# Patient Record
Sex: Female | Born: 1965
Health system: Southern US, Community
[De-identification: ages and names within clinical notes are randomized; demographics above are authoritative.]

## PROBLEM LIST (undated history)

## (undated) DIAGNOSIS — J45909 Unspecified asthma, uncomplicated: Secondary | ICD-10-CM

## (undated) HISTORY — PX: CHOLECYSTECTOMY: SHX55

## (undated) HISTORY — PX: BACK SURGERY: SHX140

## (undated) HISTORY — PX: TUBAL LIGATION: SHX77

---

## 2007-04-17 ENCOUNTER — Ambulatory Visit (HOSPITAL_COMMUNITY): Admission: RE | Admit: 2007-04-17 | Discharge: 2007-04-17 | Payer: Self-pay | Admitting: Family Medicine

## 2007-04-18 ENCOUNTER — Ambulatory Visit (HOSPITAL_COMMUNITY): Admission: RE | Admit: 2007-04-18 | Discharge: 2007-04-18 | Payer: Self-pay | Admitting: Family Medicine

## 2010-03-13 ENCOUNTER — Encounter: Payer: Self-pay | Admitting: Family Medicine

## 2010-03-13 ENCOUNTER — Encounter: Payer: Self-pay | Admitting: Neurology

## 2010-04-14 ENCOUNTER — Emergency Department (HOSPITAL_COMMUNITY): Payer: BC Managed Care – PPO

## 2010-04-14 ENCOUNTER — Emergency Department (HOSPITAL_COMMUNITY)
Admission: EM | Admit: 2010-04-14 | Discharge: 2010-04-14 | Disposition: A | Discharge status: Home / Self Care | Payer: BC Managed Care – PPO | Attending: Emergency Medicine | Admitting: Emergency Medicine

## 2010-04-14 DIAGNOSIS — R209 Unspecified disturbances of skin sensation: Secondary | ICD-10-CM | POA: Insufficient documentation

## 2010-04-14 DIAGNOSIS — R071 Chest pain on breathing: Secondary | ICD-10-CM | POA: Insufficient documentation

## 2010-04-14 DIAGNOSIS — R51 Headache: Secondary | ICD-10-CM | POA: Insufficient documentation

## 2010-04-14 LAB — CBC
HCT: 34 % — ABNORMAL LOW (ref 36.0–46.0)
Hemoglobin: 11.3 g/dL — ABNORMAL LOW (ref 12.0–15.0)
MCH: 27.2 pg (ref 26.0–34.0)
MCHC: 33.2 g/dL (ref 30.0–36.0)
MCV: 81.9 fL (ref 78.0–100.0)
RDW: 13.9 % (ref 11.5–15.5)

## 2010-04-14 LAB — BASIC METABOLIC PANEL
CO2: 26 mEq/L (ref 19–32)
Chloride: 103 mEq/L (ref 96–112)
Creatinine, Ser: 0.76 mg/dL (ref 0.4–1.2)
GFR calc Af Amer: >60 mL/min (ref >60)
Glucose, Bld: 88 mg/dL (ref 70–99)
Sodium: 138 mEq/L (ref 135–145)

## 2010-04-14 LAB — DIFFERENTIAL
Eosinophils Relative: 3 % (ref 0–5)
Lymphocytes Relative: 27 % (ref 12–46)
Monocytes Absolute: 0.4 10*3/uL (ref 0.1–1.0)
Monocytes Relative: 5 % (ref 3–12)
Neutro Abs: 6.1 10*3/uL (ref 1.7–7.7)

## 2011-05-12 ENCOUNTER — Other Ambulatory Visit: Payer: Self-pay | Admitting: Family Medicine

## 2011-05-12 ENCOUNTER — Ambulatory Visit (HOSPITAL_COMMUNITY)
Admission: RE | Admit: 2011-05-12 | Discharge: 2011-05-12 | Disposition: A | Discharge status: Home / Self Care | Payer: Self-pay | Source: Ambulatory Visit | Attending: Family Medicine | Admitting: Family Medicine

## 2011-05-12 DIAGNOSIS — M25561 Pain in right knee: Secondary | ICD-10-CM

## 2011-05-12 DIAGNOSIS — M79641 Pain in right hand: Secondary | ICD-10-CM

## 2011-05-12 DIAGNOSIS — S59909A Unspecified injury of unspecified elbow, initial encounter: Secondary | ICD-10-CM | POA: Insufficient documentation

## 2011-05-12 DIAGNOSIS — M25569 Pain in unspecified knee: Secondary | ICD-10-CM | POA: Insufficient documentation

## 2011-05-12 DIAGNOSIS — M25521 Pain in right elbow: Secondary | ICD-10-CM

## 2011-05-12 DIAGNOSIS — W19XXXA Unspecified fall, initial encounter: Secondary | ICD-10-CM | POA: Insufficient documentation

## 2011-05-12 DIAGNOSIS — M25529 Pain in unspecified elbow: Secondary | ICD-10-CM | POA: Insufficient documentation

## 2011-05-12 DIAGNOSIS — M79609 Pain in unspecified limb: Secondary | ICD-10-CM | POA: Insufficient documentation

## 2011-05-12 DIAGNOSIS — S6990XA Unspecified injury of unspecified wrist, hand and finger(s), initial encounter: Secondary | ICD-10-CM | POA: Insufficient documentation

## 2012-07-01 ENCOUNTER — Other Ambulatory Visit: Payer: Self-pay | Admitting: Nurse Practitioner

## 2012-07-01 ENCOUNTER — Other Ambulatory Visit: Payer: Self-pay | Admitting: Family Medicine

## 2012-07-01 NOTE — Telephone Encounter (Signed)
Called in refills to [facility]. Spoke with pharmacist directly.

## 2012-07-01 NOTE — Telephone Encounter (Signed)
Ok times 2 total 

## 2012-07-04 ENCOUNTER — Other Ambulatory Visit: Payer: Self-pay | Admitting: Nurse Practitioner

## 2012-09-19 ENCOUNTER — Other Ambulatory Visit: Payer: Self-pay | Admitting: Family Medicine

## 2012-11-28 ENCOUNTER — Other Ambulatory Visit: Payer: Self-pay | Admitting: Family Medicine

## 2012-11-28 ENCOUNTER — Telehealth: Payer: Self-pay | Admitting: Family Medicine

## 2012-11-28 NOTE — Telephone Encounter (Signed)
Needs office visit. Not seen in over 1 year. Transferred to front for an appt.

## 2012-11-28 NOTE — Telephone Encounter (Signed)
Patient needs refill on hydrocodone 5/325. °

## 2012-12-04 ENCOUNTER — Encounter: Payer: Self-pay | Admitting: Family Medicine

## 2012-12-04 ENCOUNTER — Encounter: Payer: Self-pay | Admitting: Nurse Practitioner

## 2012-12-04 ENCOUNTER — Ambulatory Visit (INDEPENDENT_AMBULATORY_CARE_PROVIDER_SITE_OTHER): Payer: Managed Care, Other (non HMO) | Admitting: Nurse Practitioner

## 2012-12-04 VITALS — BP 138/82 | Temp 98.9°F | Wt 248.0 lb

## 2012-12-04 DIAGNOSIS — J209 Acute bronchitis, unspecified: Secondary | ICD-10-CM

## 2012-12-04 DIAGNOSIS — J322 Chronic ethmoidal sinusitis: Secondary | ICD-10-CM

## 2012-12-04 DIAGNOSIS — R062 Wheezing: Secondary | ICD-10-CM

## 2012-12-04 DIAGNOSIS — H109 Unspecified conjunctivitis: Secondary | ICD-10-CM

## 2012-12-04 MED ORDER — AZITHROMYCIN 250 MG PO TABS: ORAL_TABLET | ORAL | Status: DC

## 2012-12-04 MED ORDER — ALBUTEROL SULFATE (2.5 MG/3ML) 0.083% IN NEBU: INHALATION_SOLUTION | RESPIRATORY_TRACT | Status: DC

## 2012-12-04 MED ORDER — ALBUTEROL SULFATE (5 MG/ML) 0.5% IN NEBU
2.5000 mg | INHALATION_SOLUTION | Freq: Once | RESPIRATORY_TRACT | Status: AC
  Administered 2012-12-04: 2.5 mg via RESPIRATORY_TRACT

## 2012-12-04 MED ORDER — PREDNISONE 20 MG PO TABS: ORAL_TABLET | ORAL | Status: DC

## 2012-12-04 NOTE — Patient Instructions (Signed)
Zaditor eye drops as directed 

## 2012-12-05 ENCOUNTER — Encounter: Payer: Self-pay | Admitting: Nurse Practitioner

## 2012-12-05 NOTE — Progress Notes (Signed)
Subjective:  Presents with complaints of slight burning in both eyes that began this morning. Minimal drainage noted. Watery eyes. No visual changes. Smokes less than one pack per day. Complaints of cough off and on for the past month. Occasionally productive. Has also had some wheezing, has not used her inhaler. Ethmoid sinus area headache over the past week. No fever. No sore throat. Bilateral ear pain. Nausea but no vomiting. No abdominal pain.  Objective:   BP 138/82  Temp(Src) 98.9 F (37.2 C) (Oral)  Wt 248 lb (112.492 kg) NAD. Alert, oriented. TMs significant clear effusion, no erythema. Conjunctiva minimally injected. No drainage or erythema. No preauricular adenopathy. Pharynx injected with PND noted. Neck supple with mild soft slightly tender anterior adenopathy. Lungs diminished breath sounds in general with faint expiratory wheeze noted particularly anterior. Shortness of breath noted with talking. Given albuterol 2.5 mg nebulizer treatment. Airflow greatly improved with expiratory wheezes noted throughout lung fields. Color normal limit. No tachypnea. Heart regular rate rhythm.  Assessment:Acute bronchitis  Wheezing - Plan: albuterol (PROVENTIL) (5 MG/ML) 0.5% nebulizer solution 2.5 mg  Ethmoid sinusitis  Conjunctivitis  Plan: Meds ordered this encounter  Medications  . albuterol (PROVENTIL) (5 MG/ML) 0.5% nebulizer solution 2.5 mg    Sig:   . albuterol (PROVENTIL) (2.5 MG/3ML) 0.083% nebulizer solution    Sig: Use via neb q 4 hrs as needed for wheezing    Dispense:  25 vial    Refill:  2    Order Specific Question:  Supervising Provider    Answer:  Merlyn Albert [[id number]]  . predniSONE (DELTASONE) 20 MG tablet    Sig: 3 po qd x 3 d then 2 po qd x 3 d then 1 po qd x 3 d    Dispense:  18 tablet    Refill:  0    Order Specific Question:  Supervising Provider    Answer:  Merlyn Albert [[id number]]  . azithromycin (ZITHROMAX Z-PAK) 250 MG tablet    Sig: Take 2 tablets (500  mg) on  Day 1,  followed by 1 tablet (250 mg) once daily on Days 2 through 5.    Dispense:  6 each    Refill:  0    Order Specific Question:  Supervising Provider    Answer:  Merlyn Albert [[id number]]   Restart albuterol via neb or inhaler every 4 hours as needed for wheezing. Discussed importance of smoking cessation. Zaditor eyedrops as directed. Warning signs reviewed. Call back in 48 hours if no improvement, sooner if worse. Also recheck if wheezing and cough persist.

## 2012-12-09 ENCOUNTER — Encounter: Payer: Self-pay | Admitting: Nurse Practitioner

## 2012-12-09 ENCOUNTER — Ambulatory Visit (INDEPENDENT_AMBULATORY_CARE_PROVIDER_SITE_OTHER): Payer: Managed Care, Other (non HMO) | Admitting: Nurse Practitioner

## 2012-12-09 VITALS — BP 138/80 | Ht 63.0 in | Wt 249.0 lb

## 2012-12-09 DIAGNOSIS — L309 Dermatitis, unspecified: Secondary | ICD-10-CM

## 2012-12-09 DIAGNOSIS — Z5189 Encounter for other specified aftercare: Secondary | ICD-10-CM

## 2012-12-09 DIAGNOSIS — M545 Low back pain, unspecified: Secondary | ICD-10-CM

## 2012-12-09 DIAGNOSIS — R52 Pain, unspecified: Secondary | ICD-10-CM

## 2012-12-09 DIAGNOSIS — L259 Unspecified contact dermatitis, unspecified cause: Secondary | ICD-10-CM

## 2012-12-09 DIAGNOSIS — G8929 Other chronic pain: Secondary | ICD-10-CM

## 2012-12-09 MED ORDER — HYDROCODONE-ACETAMINOPHEN 5-325 MG PO TABS: 1.0000 | ORAL_TABLET | Freq: Two times a day (BID) | ORAL | Status: DC | PRN

## 2012-12-09 MED ORDER — CLOBETASOL PROPIONATE 0.05 % EX OINT: TOPICAL_OINTMENT | Freq: Two times a day (BID) | CUTANEOUS | Status: DC

## 2012-12-09 NOTE — Patient Instructions (Addendum)
Cetaphil cream as directed [employer] Relief TENS unit Ice and or heat applications  [employer] TENS unit $50  Melatonin 5-10 mg at night

## 2012-12-11 ENCOUNTER — Encounter: Payer: Self-pay | Admitting: Nurse Practitioner

## 2012-12-11 DIAGNOSIS — R52 Pain, unspecified: Secondary | ICD-10-CM | POA: Insufficient documentation

## 2012-12-11 DIAGNOSIS — M545 Low back pain, unspecified: Secondary | ICD-10-CM | POA: Insufficient documentation

## 2012-12-11 DIAGNOSIS — G8929 Other chronic pain: Secondary | ICD-10-CM | POA: Insufficient documentation

## 2012-12-11 NOTE — Assessment & Plan Note (Signed)
Plan: Given 3 separate monthly prescriptions for Vicodin 5/325 one by mouth twice a day when necessary 60 per month. Switch to clobetasol 0.05% ointment twice a day to rash on feet no more than 2 weeks at a time. Recheck 3 months, call back sooner if needed. Cetaphil cream as directed [employer] Relief TENS unit Ice and or heat applications  [employer] TENS unit $50

## 2012-12-11 NOTE — Assessment & Plan Note (Signed)
Plan: Given 3 separate monthly prescriptions for Vicodin 5/325 one by mouth twice a day when necessary 60 per month. Switch to clobetasol 0.05% ointment twice a day to rash on feet no more than 2 weeks at a time. Recheck 3 months, call back sooner if needed. Cetaphil cream as directed [employer] TENS unit Ice and or heat applications  [employer] TENS unit $50 

## 2012-12-11 NOTE — Progress Notes (Signed)
Subjective:  Presents for recheck on her chronic back pain. Has had an MRI and workup through [employer] after an injury at work at [employer] last year. Patient states she has a bulging disc in the lumbar area. 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 Cannot afford to miss work for surgery. Has done physical therapy in the past with no relief. Pain in the left low back area into the left hip down the lateral leg occasionally will go all the way to the foot. Has an active job requiring walking all day. Occasional numbness and tingling in the leg. Pain is 9/10 on a pain scale first thing in the morning. A dose of hydrocodone 5 mg/APAP 325 mg brings the pain down to 6/10 which is tolerable. Patient is able to work and function. Pain is worse on days when she works, 9/10 on pain scale in the evenings, takes her second pain pill at nighttime to help her rest. Of note patient recently completed a course of prednisone for her breathing but this also cleared up the eczema on her feet. No relief with the Diprolene cream. Prednisone did not seem to help her back.  Objective:   BP 138/80  Ht 5\' 3"  (1.6 m)  Wt 249 lb (112.946 kg)  BMI 44.12 kg/m2 NAD. Alert, oriented. Lungs clear. Heart regular rate rhythm. Minimal lumbar spinal tenderness. Distinct tenderness in the left lumbar area. SLR negative on the right, positive on the left. Reflexes normal limit lower extremities. Gait normal limit. Good ROM of the back with minimal limitation. The lateral part of her feet which were scaly at one point are clear.  Assessment:Chronic low back pain  Pain management  Eczema  questionable psoriasis  Plan: Given 3 separate monthly prescriptions for Vicodin 5/325 one by mouth twice a day when necessary 60 per month. Switch to clobetasol 0.05% ointment twice a day to rash on feet no more than 2 weeks at a time. Recheck 3 months, call back sooner if needed. Cetaphil cream as directed [employer] Relief TENS unit Ice and  or heat applications  [employer] TENS unit $50

## 2013-01-10 ENCOUNTER — Other Ambulatory Visit: Payer: Self-pay | Admitting: Family Medicine

## 2013-02-20 ENCOUNTER — Other Ambulatory Visit: Payer: Self-pay | Admitting: Nurse Practitioner

## 2013-02-21 NOTE — Telephone Encounter (Signed)
Last office visit 11/2012

## 2013-03-31 ENCOUNTER — Encounter: Payer: Self-pay | Admitting: Family Medicine

## 2013-03-31 ENCOUNTER — Ambulatory Visit (INDEPENDENT_AMBULATORY_CARE_PROVIDER_SITE_OTHER): Payer: Managed Care, Other (non HMO) | Admitting: Family Medicine

## 2013-03-31 VITALS — BP 134/90 | Temp 98.7°F | Ht 63.0 in | Wt 254.0 lb

## 2013-03-31 DIAGNOSIS — R197 Diarrhea, unspecified: Secondary | ICD-10-CM

## 2013-03-31 DIAGNOSIS — M545 Low back pain, unspecified: Secondary | ICD-10-CM

## 2013-03-31 DIAGNOSIS — L309 Dermatitis, unspecified: Secondary | ICD-10-CM

## 2013-03-31 DIAGNOSIS — R52 Pain, unspecified: Secondary | ICD-10-CM

## 2013-03-31 DIAGNOSIS — G8929 Other chronic pain: Secondary | ICD-10-CM

## 2013-03-31 DIAGNOSIS — Z5189 Encounter for other specified aftercare: Secondary | ICD-10-CM

## 2013-03-31 DIAGNOSIS — L259 Unspecified contact dermatitis, unspecified cause: Secondary | ICD-10-CM

## 2013-03-31 DIAGNOSIS — J45909 Unspecified asthma, uncomplicated: Secondary | ICD-10-CM

## 2013-03-31 MED ORDER — CLOBETASOL PROPIONATE 0.05 % EX OINT: TOPICAL_OINTMENT | CUTANEOUS | Status: DC

## 2013-03-31 MED ORDER — ALBUTEROL SULFATE HFA 108 (90 BASE) MCG/ACT IN AERS: 2.0000 | INHALATION_SPRAY | Freq: Four times a day (QID) | RESPIRATORY_TRACT | Status: DC | PRN

## 2013-03-31 MED ORDER — FLUOXETINE HCL 20 MG PO CAPS: ORAL_CAPSULE | ORAL | Status: DC

## 2013-03-31 MED ORDER — HYDROCODONE-ACETAMINOPHEN 5-325 MG PO TABS: 1.0000 | ORAL_TABLET | Freq: Two times a day (BID) | ORAL | Status: DC | PRN

## 2013-03-31 MED ORDER — DIPHENOXYLATE-ATROPINE 2.5-0.025 MG PO TABS: 1.0000 | ORAL_TABLET | Freq: Four times a day (QID) | ORAL | Status: DC | PRN

## 2013-03-31 MED ORDER — BECLOMETHASONE DIPROPIONATE 80 MCG/ACT IN AERS: 2.0000 | INHALATION_SPRAY | Freq: Two times a day (BID) | RESPIRATORY_TRACT | Status: DC

## 2013-03-31 MED ORDER — AZITHROMYCIN 250 MG PO TABS: ORAL_TABLET | ORAL | Status: DC

## 2013-03-31 NOTE — Progress Notes (Signed)
   Subjective:    Patient ID: Megan Parker, female    DOB: 09/19/1965, 48 y.o.   MRN: [id number]  Diarrhea  This is a new problem. The current episode started today. The problem occurs 5 to 10 times per day. The stool consistency is described as watery. Associated symptoms include coughing. Risk factors include ill contacts. She has tried nothing for the symptoms.   Moderate cough and wheeze foir weeks Patient does relate low back pain discomfort hurts with certain movements and rotations. 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 She still smokes she knows she needs to quit She also relates head congestion drainage coughing sinus pressure. No vomiting but some diarrhea for the past few days  Review of Systems  Respiratory: Positive for cough.   Gastrointestinal: Positive for diarrhea.  nasal d/c for awhile      Objective:   Physical Exam  Nursing note and vitals reviewed. Constitutional: She appears well-developed.  HENT:  Head: Normocephalic.  Nose: Nose normal.  Mouth/Throat: Oropharynx is clear and moist. No oropharyngeal exudate.  Neck: Neck supple.  Cardiovascular: Normal rate and normal heart sounds.   No murmur heard. Pulmonary/Chest: Effort normal. No respiratory distress. She has wheezes. She has no rales.  Lymphadenopathy:    She has no cervical adenopathy.  Skin: Skin is warm and dry.          Assessment & Plan:  Diarrhea--probably viral. Lomotil as necessary. Bland diet today. Asthma-persistent nature of this. She needs a daily steroid inhaler this was sent in. Use a rescue inhaler as necessary but gradually reduce the frequency of this as steroid inhaler helps. Patient was encouraged to quit smoking for her health and reducing asthma and cancer risk Mild sinus infection antibiotics in Chronic lumbar pain refill of hydrocodone was given

## 2013-06-10 ENCOUNTER — Ambulatory Visit (INDEPENDENT_AMBULATORY_CARE_PROVIDER_SITE_OTHER): Payer: Managed Care, Other (non HMO) | Admitting: Family Medicine

## 2013-06-10 ENCOUNTER — Encounter: Payer: Self-pay | Admitting: Family Medicine

## 2013-06-10 VITALS — BP 122/82 | Ht 63.0 in | Wt 249.0 lb

## 2013-06-10 DIAGNOSIS — J45909 Unspecified asthma, uncomplicated: Secondary | ICD-10-CM

## 2013-06-10 DIAGNOSIS — T148 Other injury of unspecified body region: Secondary | ICD-10-CM

## 2013-06-10 DIAGNOSIS — W57XXXA Bitten or stung by nonvenomous insect and other nonvenomous arthropods, initial encounter: Secondary | ICD-10-CM

## 2013-06-10 MED ORDER — NAPROXEN SODIUM 550 MG PO TABS: ORAL_TABLET | ORAL | Status: DC

## 2013-06-10 MED ORDER — HYDROCODONE-ACETAMINOPHEN 5-325 MG PO TABS: 1.0000 | ORAL_TABLET | Freq: Two times a day (BID) | ORAL | Status: DC | PRN

## 2013-06-10 MED ORDER — DOXYCYCLINE HYCLATE 100 MG PO CAPS: 100.0000 mg | ORAL_CAPSULE | Freq: Two times a day (BID) | ORAL | Status: DC

## 2013-06-10 MED ORDER — FLUOXETINE HCL 20 MG PO CAPS: ORAL_CAPSULE | ORAL | Status: DC

## 2013-06-10 NOTE — Progress Notes (Signed)
   Subjective:    Patient ID: Megan Parker, female    DOB: 04/03/1965, 48 y.o.   MRN: [id number]  HPI Patient arrives with tick bite-removed tick late Friday pm and has been tired since. Paced H6 she pulled a tick off on Friday wasn't sure how long he had been on denies any previous trouble with it. She also has history of asthma cannot afford asthma inhaler she is just using albuterol she understands the importance of quit smoking  Review of Systems Wheezing, cough, tick bite with tenderness, fatigue, no fever no vomiting    Objective:   Physical Exam Has to tear edematous areas on her sideward tic better both are tender red one is swollen red tender no abscess approximately inch in diameter the other three fourths of an inch and not check. Blood pressure good lungs reveal bilateral expiratory wheezes extremities no edema       Assessment & Plan:  Patient counseled to quit smoking Asthma she will check in with her insurance company to see if there is a low-cost preventative inhaler she could use if so she will let us know we will call again Doxycycline twice a day 10 days for tick related rash/bite  Warnings were discussed regarding high fevers or other problems followup immediately

## 2013-09-10 ENCOUNTER — Ambulatory Visit (INDEPENDENT_AMBULATORY_CARE_PROVIDER_SITE_OTHER): Payer: Managed Care, Other (non HMO) | Admitting: Nurse Practitioner

## 2013-09-10 ENCOUNTER — Ambulatory Visit (HOSPITAL_COMMUNITY)
Admission: RE | Admit: 2013-09-10 | Discharge: 2013-09-10 | Disposition: A | Discharge status: Home / Self Care | Payer: Managed Care, Other (non HMO) | Source: Ambulatory Visit | Attending: Nurse Practitioner | Admitting: Nurse Practitioner

## 2013-09-10 ENCOUNTER — Encounter: Payer: Self-pay | Admitting: Family Medicine

## 2013-09-10 ENCOUNTER — Encounter: Payer: Self-pay | Admitting: Nurse Practitioner

## 2013-09-10 VITALS — BP 134/82 | Resp 18 | Ht 63.0 in | Wt 248.0 lb

## 2013-09-10 DIAGNOSIS — J4540 Moderate persistent asthma, uncomplicated: Secondary | ICD-10-CM

## 2013-09-10 DIAGNOSIS — R0609 Other forms of dyspnea: Secondary | ICD-10-CM

## 2013-09-10 DIAGNOSIS — Z13220 Encounter for screening for lipoid disorders: Secondary | ICD-10-CM

## 2013-09-10 DIAGNOSIS — R5383 Other fatigue: Principal | ICD-10-CM

## 2013-09-10 DIAGNOSIS — J45909 Unspecified asthma, uncomplicated: Secondary | ICD-10-CM

## 2013-09-10 DIAGNOSIS — R062 Wheezing: Secondary | ICD-10-CM | POA: Insufficient documentation

## 2013-09-10 DIAGNOSIS — N912 Amenorrhea, unspecified: Secondary | ICD-10-CM

## 2013-09-10 DIAGNOSIS — R0600 Dyspnea, unspecified: Secondary | ICD-10-CM

## 2013-09-10 DIAGNOSIS — K219 Gastro-esophageal reflux disease without esophagitis: Secondary | ICD-10-CM

## 2013-09-10 DIAGNOSIS — R0602 Shortness of breath: Secondary | ICD-10-CM

## 2013-09-10 DIAGNOSIS — R0989 Other specified symptoms and signs involving the circulatory and respiratory systems: Secondary | ICD-10-CM

## 2013-09-10 DIAGNOSIS — R5381 Other malaise: Secondary | ICD-10-CM

## 2013-09-10 MED ORDER — PREDNISONE 20 MG PO TABS: ORAL_TABLET | ORAL | Status: DC

## 2013-09-10 MED ORDER — ALBUTEROL SULFATE (2.5 MG/3ML) 0.083% IN NEBU
2.5000 mg | INHALATION_SOLUTION | Freq: Once | RESPIRATORY_TRACT | Status: AC
Start: 2013-09-10 — End: 2013-09-10
  Administered 2013-09-10: 2.5 mg via RESPIRATORY_TRACT

## 2013-09-10 NOTE — Progress Notes (Signed)
Subjective:  Presents for c/o fatigue that began for past several months. Was treated for asthma in April with continued symptoms. Slight SOB and wheeze all the time even with rest. Minimal relief with albuterol inhaler. Did not get Qvar inhaler due to cost. No chest pain/ischemic type pain. No fever. Some coughing. Mild reflux mainly when she drinks water about 3/7 days of the week. Weight gain. Sleepy and tired all the time; denies symptoms of sleep apnea but not sure if family hears any snoring. Has not had intercourse in months; husband is unable. Last menses about 2 months ago. Slight swelling in lower legs at times. No orthopnea.   Objective:   BP 134/82  Resp 18  Ht 5\' 3"  (1.6 m)  Wt 248 lb (112.492 kg)  BMI 43.94 kg/m2 NAD. Alert, oriented. TMs mild clear effusion, no erythema. Pharynx mildly injected. Neck supple with mild soft anterior adenopathy. Thryoid; nontender; no masses or goiter.Lungs: initially decreased airflow with one posterior wheeze. No tachypnea but mild SOB with occas wheezing cough noted with talking. Given albuterol 2.5 mg neb treatment. Airflow at least 50 % improved with scattered exp wheezes. Heart RRR. abd soft, nondistended with very mild epigastric area tenderness. Lower extremities: trace pitting edema.  Assessment: Other malaise and fatigue - Plan: CBC with Differential, Hepatic function panel, Basic metabolic panel, TSH  DOE (dyspnea on exertion) - Plan: Brain natriuretic peptide  Amenorrhea - Plan: FSH, LH  SOB (shortness of breath) - Plan: DG Chest 2 View  Asthma, moderate persistent, uncomplicated - Plan: DG Chest 2 View, albuterol (PROVENTIL) (2.5 MG/3ML) 0.083% nebulizer solution 2.5 mg  Screening, lipid - Plan: Lipid panel  Gastroesophageal reflux disease without esophagitis    Plan:  Meds ordered this encounter  Medications  . albuterol (PROVENTIL) (2.5 MG/3ML) 0.083% nebulizer solution 2.5 mg    Sig:   . predniSONE (DELTASONE) 20 MG tablet     Sig: 2 po qd x 5 d    Dispense:  10 tablet    Refill:  0    Order Specific Question:  Supervising Provider    Answer:  Merlyn AlbertLUKING, WILLIAM S [[id number]]   Zantac OTC as directed. Patient to check to see which steroid inhaler is on her preferred list and call back to office.  Warning signs reviewed. Obtain labs and xray after Prednisone is complete. Office visit first week of August, sooner if worse.

## 2013-09-10 NOTE — Patient Instructions (Signed)
Preferred inhaled steroid

## 2013-09-11 ENCOUNTER — Telehealth: Payer: Self-pay | Admitting: Family Medicine

## 2013-09-11 ENCOUNTER — Encounter: Payer: Self-pay | Admitting: Family Medicine

## 2013-09-11 MED ORDER — ALBUTEROL SULFATE HFA 108 (90 BASE) MCG/ACT IN AERS: 2.0000 | INHALATION_SPRAY | Freq: Four times a day (QID) | RESPIRATORY_TRACT | Status: DC | PRN

## 2013-09-11 MED ORDER — HYDROCODONE-ACETAMINOPHEN 5-325 MG PO TABS: 1.0000 | ORAL_TABLET | Freq: Two times a day (BID) | ORAL | Status: DC | PRN

## 2013-09-11 NOTE — Telephone Encounter (Signed)
Patient needs work note for 7/22-7/24 returning 7/24 to work.refill on inhaler and hydrocodone5/325

## 2013-09-11 NOTE — Addendum Note (Signed)
Addended byOneal Deputy: Oluwadamilola Rosamond D on: 09/11/2013 11:40 AM   Modules accepted: Orders

## 2013-09-11 NOTE — Progress Notes (Signed)
Patient notified and verbalized understanding of the test results. No further questions. 

## 2013-09-11 NOTE — Telephone Encounter (Signed)
Work excuse done

## 2013-09-11 NOTE — Telephone Encounter (Signed)
Patient notified and verbalized understanding. 

## 2013-09-11 NOTE — Telephone Encounter (Signed)
Please write WE for patient. Thanks!

## 2013-09-11 NOTE — Telephone Encounter (Signed)
May give 6 refills on inhaler. One refill on pain medicine. If needing ongoing pain medicine this requires every 3 month visit for chronic pain management.

## 2013-09-12 ENCOUNTER — Telehealth: Payer: Self-pay | Admitting: Family Medicine

## 2013-09-12 LAB — CBC WITH DIFFERENTIAL/PLATELET
BASOS ABS: 0 10*3/uL (ref 0.0–0.1)
Basophils Relative: 0 % (ref 0–1)
EOS PCT: 0 % (ref 0–5)
Eosinophils Absolute: 0 10*3/uL (ref 0.0–0.7)
HCT: 38.5 % (ref 36.0–46.0)
Hemoglobin: 13.2 g/dL (ref 12.0–15.0)
LYMPHS ABS: 2.6 10*3/uL (ref 0.7–4.0)
LYMPHS PCT: 20 % (ref 12–46)
MCH: 27 pg (ref 26.0–34.0)
MCHC: 34.3 g/dL (ref 30.0–36.0)
MCV: 78.9 fL (ref 78.0–100.0)
Monocytes Absolute: 0.6 10*3/uL (ref 0.1–1.0)
Monocytes Relative: 5 % (ref 3–12)
NEUTROS PCT: 75 % (ref 43–77)
Neutro Abs: 9.6 10*3/uL — ABNORMAL HIGH (ref 1.7–7.7)
PLATELETS: 266 10*3/uL (ref 150–400)
RBC: 4.88 MIL/uL (ref 3.87–5.11)
RDW: 14.2 % (ref 11.5–15.5)
WBC: 12.8 10*3/uL — ABNORMAL HIGH (ref 4.0–10.5)

## 2013-09-12 LAB — TSH: TSH: 0.854 u[IU]/mL (ref 0.350–4.500)

## 2013-09-12 LAB — HEPATIC FUNCTION PANEL
ALBUMIN: 4.4 g/dL (ref 3.5–5.2)
ALK PHOS: 77 U/L (ref 39–117)
ALT: 24 U/L (ref 0–35)
AST: 14 U/L (ref 0–37)
BILIRUBIN TOTAL: 0.4 mg/dL (ref 0.2–1.2)
Bilirubin, Direct: <0.1 mg/dL (ref 0.0–0.3)
Total Protein: 6.8 g/dL (ref 6.0–8.3)

## 2013-09-12 LAB — BASIC METABOLIC PANEL
BUN: 16 mg/dL (ref 6–23)
CALCIUM: 9.7 mg/dL (ref 8.4–10.5)
CO2: 24 meq/L (ref 19–32)
CREATININE: 0.72 mg/dL (ref 0.50–1.10)
Chloride: 104 mEq/L (ref 96–112)
Glucose, Bld: 87 mg/dL (ref 70–99)
Potassium: 4.7 mEq/L (ref 3.5–5.3)
SODIUM: 139 meq/L (ref 135–145)

## 2013-09-12 LAB — FOLLICLE STIMULATING HORMONE: FSH: 15.7 m[IU]/mL

## 2013-09-12 LAB — LIPID PANEL
CHOL/HDL RATIO: 2 ratio
CHOLESTEROL: 168 mg/dL (ref 0–200)
HDL: 84 mg/dL (ref >39)
LDL Cholesterol: 65 mg/dL (ref 0–99)
Triglycerides: 96 mg/dL (ref <150)
VLDL: 19 mg/dL (ref 0–40)

## 2013-09-12 LAB — LUTEINIZING HORMONE: LH: 12.7 m[IU]/mL

## 2013-09-12 LAB — BRAIN NATRIURETIC PEPTIDE: Brain Natriuretic Peptide: 31.2 pg/mL (ref 0.0–100.0)

## 2013-09-12 NOTE — Telephone Encounter (Signed)
Patient said that she was instructed to call us and tell us what Steroid inhaler is covered by her insurance. They cover Proventil and Ventolin.  [facility]

## 2013-09-14 NOTE — Telephone Encounter (Signed)
Nurses: please advise patient that those are still albuterol inhalers NOT steroid. Please call pharmacy and see if we can find out which one is on preferred list. Thanks!

## 2013-09-15 NOTE — Telephone Encounter (Signed)
Pt said they will cover Flovent and Serevent

## 2013-09-18 ENCOUNTER — Other Ambulatory Visit: Payer: Self-pay | Admitting: Nurse Practitioner

## 2013-09-18 MED ORDER — FLUTICASONE PROPIONATE HFA 110 MCG/ACT IN AERO: 2.0000 | INHALATION_SPRAY | Freq: Two times a day (BID) | RESPIRATORY_TRACT | Status: DC

## 2013-09-18 NOTE — Telephone Encounter (Signed)
Patient notified

## 2013-09-18 NOTE — Telephone Encounter (Signed)
Sent in Rx for flovent.

## 2013-09-26 ENCOUNTER — Ambulatory Visit (INDEPENDENT_AMBULATORY_CARE_PROVIDER_SITE_OTHER): Payer: Managed Care, Other (non HMO) | Admitting: Nurse Practitioner

## 2013-09-26 ENCOUNTER — Encounter: Payer: Self-pay | Admitting: Nurse Practitioner

## 2013-09-26 VITALS — BP 140/92 | Ht 63.0 in | Wt 254.0 lb

## 2013-09-26 DIAGNOSIS — R5383 Other fatigue: Secondary | ICD-10-CM

## 2013-09-26 DIAGNOSIS — R5381 Other malaise: Secondary | ICD-10-CM

## 2013-09-26 DIAGNOSIS — R52 Pain, unspecified: Secondary | ICD-10-CM

## 2013-09-26 DIAGNOSIS — M545 Low back pain, unspecified: Secondary | ICD-10-CM

## 2013-09-26 DIAGNOSIS — R0609 Other forms of dyspnea: Secondary | ICD-10-CM

## 2013-09-26 DIAGNOSIS — Z5189 Encounter for other specified aftercare: Secondary | ICD-10-CM

## 2013-09-26 DIAGNOSIS — R0989 Other specified symptoms and signs involving the circulatory and respiratory systems: Secondary | ICD-10-CM

## 2013-09-26 DIAGNOSIS — N951 Menopausal and female climacteric states: Secondary | ICD-10-CM

## 2013-09-26 DIAGNOSIS — N912 Amenorrhea, unspecified: Secondary | ICD-10-CM

## 2013-09-26 DIAGNOSIS — R0683 Snoring: Secondary | ICD-10-CM

## 2013-09-26 DIAGNOSIS — G8929 Other chronic pain: Secondary | ICD-10-CM

## 2013-09-26 MED ORDER — HYDROCODONE-ACETAMINOPHEN 5-325 MG PO TABS
1.0000 | ORAL_TABLET | Freq: Two times a day (BID) | ORAL | Status: DC | PRN
Start: 2013-09-26 — End: 2013-12-31

## 2013-09-26 MED ORDER — MEDROXYPROGESTERONE ACETATE 10 MG PO TABS: 10.0000 mg | ORAL_TABLET | Freq: Every day | ORAL | Status: DC

## 2013-09-26 MED ORDER — HYDROCODONE-ACETAMINOPHEN 5-325 MG PO TABS: 1.0000 | ORAL_TABLET | Freq: Two times a day (BID) | ORAL | Status: DC | PRN

## 2013-09-30 ENCOUNTER — Encounter: Payer: Self-pay | Admitting: Nurse Practitioner

## 2013-09-30 DIAGNOSIS — N951 Menopausal and female climacteric states: Secondary | ICD-10-CM | POA: Insufficient documentation

## 2013-09-30 NOTE — Progress Notes (Signed)
Subjective:  Presents to review her recent labs. Has not had a menses in 2 months. No pelvic pain. Married, same partner, not sexually active at this time. Also takes hydrocodone no more than twice a day for chronic back pain. Worse some days than others. Stays tired all the time. Has been told she snores. Compulsive about cleaning and organizing her house. Everything has to be it's place. Emotional lability. Denies suicidal or homicidal thoughts or ideation.   Objective:   BP 140/92  Ht 5\' 3"  (1.6 m)  Wt 254 lb (115.214 kg)  BMI 45.01 kg/m2 NAD. Alert, oriented. Lungs clear. Heart RRR. FSH and LH on 7/24 indicate patient has NOT completed menopause. Reviewed labs with patient.   Assessment:  Problem List Items Addressed This Visit     Other   Chronic low back pain   Relevant Medications      HYDROcodone-acetaminophen (NORCO/VICODIN) 5-325 MG per tablet   Pain management    Other Visit Diagnoses   Amenorrhea    -  Primary    Other malaise and fatigue        Snoring          Plan:  Meds ordered this encounter  Medications  . DISCONTD: HYDROcodone-acetaminophen (NORCO/VICODIN) 5-325 MG per tablet    Sig: Take 1 tablet by mouth 2 (two) times daily as needed.    Dispense:  60 tablet    Refill:  0    May fill 30 days from 09/11/13    Order Specific Question:  Supervising Provider    Answer:  Merlyn AlbertLUKING, WILLIAM S [[id number]]  . HYDROcodone-acetaminophen (NORCO/VICODIN) 5-325 MG per tablet    Sig: Take 1 tablet by mouth 2 (two) times daily as needed.    Dispense:  60 tablet    Refill:  0    May fill 60 days from 09/11/13    Order Specific Question:  Supervising Provider    Answer:  Merlyn AlbertLUKING, WILLIAM S [[id number]]  . medroxyPROGESTERone (PROVERA) 10 MG tablet    Sig: Take 1 tablet (10 mg total) by mouth daily. X 10 d at least once every 3 months    Dispense:  10 tablet    Refill:  3    Order Specific Question:  Supervising Provider    Answer:  Merlyn AlbertLUKING, WILLIAM S [[id number]]   Recommend that patient  have a cycle at least every 3 months. Call if no withdrawal bleeding with Provera. Given [place] of [place]; to drop off or mail to us once she gets her husband's input. Will explore issues around OCD symptoms at next visit.  Return in about 3 months (around 12/27/2013).

## 2013-10-23 ENCOUNTER — Encounter: Payer: Self-pay | Admitting: Family Medicine

## 2013-10-23 ENCOUNTER — Ambulatory Visit (INDEPENDENT_AMBULATORY_CARE_PROVIDER_SITE_OTHER): Payer: Managed Care, Other (non HMO) | Admitting: Family Medicine

## 2013-10-23 VITALS — BP 126/86 | Temp 98.1°F | Ht 63.0 in | Wt 253.4 lb

## 2013-10-23 DIAGNOSIS — J45901 Unspecified asthma with (acute) exacerbation: Secondary | ICD-10-CM

## 2013-10-23 DIAGNOSIS — J4521 Mild intermittent asthma with (acute) exacerbation: Secondary | ICD-10-CM

## 2013-10-23 MED ORDER — PREDNISONE 20 MG PO TABS: ORAL_TABLET | ORAL | Status: DC

## 2013-10-23 MED ORDER — HYDROCODONE-HOMATROPINE 5-1.5 MG/5ML PO SYRP
5.0000 mL | ORAL_SOLUTION | Freq: Every evening | ORAL | Status: DC | PRN
Start: 2013-10-23 — End: 2013-12-31

## 2013-10-23 MED ORDER — LEVOFLOXACIN 500 MG PO TABS: 500.0000 mg | ORAL_TABLET | Freq: Every day | ORAL | Status: DC

## 2013-10-23 NOTE — Progress Notes (Signed)
   Subjective:    Patient ID: Megan Parker, female    DOB: June 16, 1965, 48 y.o.   MRN: [id number]  URI  This is a new problem. The current episode started 1 to 4 weeks ago. The problem has been unchanged. There has been no fever. Associated symptoms include congestion, coughing, ear pain and a sore throat. Associated symptoms comments: chills. She has tried decongestant for the symptoms. The treatment provided no relief.   Patient states that she has no other concerns at this time.   Patient does smoke unfortunately.  Underlying wheezing has worsened considerably. Review of Systems  HENT: Positive for congestion, ear pain and sore throat.   Respiratory: Positive for cough.        Objective:   Physical Exam Alert moderate malaise. Noticeable wheezing with cough. No tachypnea HET moderate his congestion lungs bilateral wheezes heart regular in rhythm.       Assessment & Plan:  Impression rhinosinusitis/bronchitis with exacerbation of asthma plan encouraged to stop smoking. Prednisone taper. Antibiotics prescribed. WSL

## 2013-10-24 ENCOUNTER — Encounter: Payer: Self-pay | Admitting: Family Medicine

## 2013-11-05 ENCOUNTER — Telehealth: Payer: Self-pay | Admitting: Family Medicine

## 2013-11-05 ENCOUNTER — Other Ambulatory Visit: Payer: Self-pay | Admitting: Family Medicine

## 2013-11-05 MED ORDER — MOMETASONE FUROATE 220 MCG/INH IN AEPB: 2.0000 | INHALATION_SPRAY | Freq: Every day | RESPIRATORY_TRACT | Status: DC

## 2013-11-05 NOTE — Telephone Encounter (Signed)
LMOM explaining to pt new med sent in and why

## 2013-11-05 NOTE — Telephone Encounter (Signed)
asmanex was sent and please notify patient

## 2013-11-05 NOTE — Telephone Encounter (Signed)
Pt's [employer] coverage DENIED pt's FLOVENT HFA.  Patient needs documented failure on Qvar (which pt has tried & failed) and Asmanex (no documentation of trying this one) Please advise [facility]

## 2013-12-31 ENCOUNTER — Ambulatory Visit (INDEPENDENT_AMBULATORY_CARE_PROVIDER_SITE_OTHER): Payer: Managed Care, Other (non HMO) | Admitting: Nurse Practitioner

## 2013-12-31 ENCOUNTER — Encounter: Payer: Self-pay | Admitting: Nurse Practitioner

## 2013-12-31 VITALS — BP 122/74 | Ht 63.0 in | Wt 254.0 lb

## 2013-12-31 DIAGNOSIS — M545 Low back pain: Secondary | ICD-10-CM

## 2013-12-31 DIAGNOSIS — G8929 Other chronic pain: Secondary | ICD-10-CM

## 2013-12-31 DIAGNOSIS — R52 Pain, unspecified: Secondary | ICD-10-CM

## 2013-12-31 DIAGNOSIS — N951 Menopausal and female climacteric states: Secondary | ICD-10-CM

## 2013-12-31 DIAGNOSIS — Z5189 Encounter for other specified aftercare: Secondary | ICD-10-CM

## 2013-12-31 DIAGNOSIS — L301 Dyshidrosis [pompholyx]: Secondary | ICD-10-CM

## 2013-12-31 DIAGNOSIS — J4520 Mild intermittent asthma, uncomplicated: Secondary | ICD-10-CM

## 2013-12-31 MED ORDER — HYDROCODONE-ACETAMINOPHEN 5-325 MG PO TABS: 1.0000 | ORAL_TABLET | Freq: Two times a day (BID) | ORAL | Status: DC | PRN

## 2013-12-31 MED ORDER — MEDROXYPROGESTERONE ACETATE 10 MG PO TABS: 10.0000 mg | ORAL_TABLET | Freq: Every day | ORAL | Status: DC

## 2013-12-31 MED ORDER — KETOCONAZOLE 2 % EX CREA: 1.0000 "application " | TOPICAL_CREAM | Freq: Two times a day (BID) | CUTANEOUS | Status: DC

## 2013-12-31 NOTE — Patient Instructions (Signed)
Vertical sleeve gastrectomy 

## 2014-01-02 ENCOUNTER — Encounter: Payer: Self-pay | Admitting: Nurse Practitioner

## 2014-01-02 NOTE — Progress Notes (Signed)
Subjective:  Presents for routine follow up. Finally had a normal cycle in September over a month after finishing Provera. Has some confusion about how and when to take med. Continues to have wheezing. Insurance will only cover Asmanex for preventive. Has cats in her home which contributes to her symptoms. Also smokes 1/2 ppd. Uses albuterol 2-3 x per week. Limited exercise. Some edema lower legs; has varicose veins. Continues to have eczema on both feet; comes and goes. Very pruritic. Has been applying voltaren gel which seems to help dryness.  Objective:   BP 122/74 mmHg  Ht 5\' 3"  (1.6 m)  Wt 254 lb (115.214 kg)  BMI 45.01 kg/m2  LMP 10/31/2013 NAD. Alert, oriented. Lungs clear. Heart regular rate rhythm. Superficial varicosities noted lower extremities. Minimal edema. Confluent pink shiny rash noted over the lateral part of both feet with scaling along the edges.  Assessment:  Problem List Items Addressed This Visit      Respiratory   Asthma, chronic     Other   Chronic low back pain - Primary   Relevant Medications      HYDROcodone-acetaminophen (NORCO/VICODIN) 5-325 MG per tablet   Pain management   Perimenopause   Morbid obesity    Other Visit Diagnoses    Dyshidrotic eczema          Meds ordered this encounter  Medications  . diclofenac sodium (VOLTAREN) 1 % GEL    Sig: Apply topically 2 (two) times daily.  . medroxyPROGESTERone (PROVERA) 10 MG tablet    Sig: Take 1 tablet (10 mg total) by mouth daily. X 10 d at least once every 3 months    Dispense:  10 tablet    Refill:  3    Order Specific Question:  Supervising Provider    Answer:  Merlyn AlbertLUKING, WILLIAM S [[id number]]  . ketoconazole (NIZORAL) 2 % cream    Sig: Apply 1 application topically 2 (two) times daily. X 2 weeks    Dispense:  60 g    Refill:  0    Order Specific Question:  Supervising Provider    Answer:  Merlyn AlbertLUKING, WILLIAM S [[id number]]  . DISCONTD: HYDROcodone-acetaminophen (NORCO/VICODIN) 5-325 MG per tablet    Sig:  Take 1 tablet by mouth 2 (two) times daily as needed.    Dispense:  60 tablet    Refill:  0    May fill 60 days from 12/31/13    Order Specific Question:  Supervising Provider    Answer:  Merlyn AlbertLUKING, WILLIAM S [[id number]]  . DISCONTD: HYDROcodone-acetaminophen (NORCO/VICODIN) 5-325 MG per tablet    Sig: Take 1 tablet by mouth 2 (two) times daily as needed.    Dispense:  60 tablet    Refill:  0    May fill 30 days from 12/31/13    Order Specific Question:  Supervising Provider    Answer:  Merlyn AlbertLUKING, WILLIAM S [[id number]]  . HYDROcodone-acetaminophen (NORCO/VICODIN) 5-325 MG per tablet    Sig: Take 1 tablet by mouth 2 (two) times daily as needed.    Dispense:  60 tablet    Refill:  0    Order Specific Question:  Supervising Provider    Answer:  Merlyn AlbertLUKING, WILLIAM S [[id number]]   Reviewed options regarding progesterone therapy. Patient to take Provera at a minimum once every 3 months. Call if no withdrawal bleeding. Restart steroid cream. Stop using Voltaren cream on feet. Had ketoconazole as directed. Has the appearance of dyshidrotic eczema with a possible secondary fungal infection. Possible psoriasis. Patient  to call back in 2-3 weeks if rash is not significantly improved, recommend dermatology referral at that time. Return in about 3 months (around 04/02/2014).

## 2014-02-02 ENCOUNTER — Other Ambulatory Visit: Payer: Self-pay | Admitting: Family Medicine

## 2014-03-02 ENCOUNTER — Ambulatory Visit (INDEPENDENT_AMBULATORY_CARE_PROVIDER_SITE_OTHER): Payer: Managed Care, Other (non HMO) | Admitting: Nurse Practitioner

## 2014-03-02 ENCOUNTER — Encounter: Payer: Self-pay | Admitting: Nurse Practitioner

## 2014-03-02 VITALS — BP 134/88 | Ht 63.0 in | Wt 250.4 lb

## 2014-03-02 DIAGNOSIS — J4520 Mild intermittent asthma, uncomplicated: Secondary | ICD-10-CM

## 2014-03-02 DIAGNOSIS — N951 Menopausal and female climacteric states: Secondary | ICD-10-CM

## 2014-03-02 DIAGNOSIS — R52 Pain, unspecified: Secondary | ICD-10-CM

## 2014-03-02 DIAGNOSIS — M545 Low back pain, unspecified: Secondary | ICD-10-CM

## 2014-03-02 DIAGNOSIS — G8929 Other chronic pain: Secondary | ICD-10-CM

## 2014-03-02 DIAGNOSIS — Z5189 Encounter for other specified aftercare: Secondary | ICD-10-CM

## 2014-03-02 MED ORDER — NAPROXEN SODIUM 550 MG PO TABS: ORAL_TABLET | ORAL | Status: DC

## 2014-03-02 MED ORDER — HYDROCODONE-ACETAMINOPHEN 5-325 MG PO TABS: 1.0000 | ORAL_TABLET | Freq: Two times a day (BID) | ORAL | Status: DC | PRN

## 2014-03-04 ENCOUNTER — Encounter: Payer: Self-pay | Admitting: Nurse Practitioner

## 2014-03-04 NOTE — Progress Notes (Signed)
Subjective:  Presents for routine follow up. Did not have to take Provera. Having regular menses now. Asthma fairly stable on Asmanex (this is the only med her insurance will cover at reasonable cost). Rare use of Albuterol. Taking her pain med twice a day which keeps her pain under reasonable control so she can work and perform ADL's. Has cut back on smoking.   Objective:   BP 134/88 mmHg  Ht 5\' 3"  (1.6 m)  Wt 250 lb 6 oz (113.569 kg)  BMI 44.36 kg/m2 NAD. Alert, oriented. Lungs: BS mildly diminished with occasional faint expiratory wheeze. Heart RRR. Lower extremities no edema.  Assessment:  Problem List Items Addressed This Visit      Respiratory   Asthma, chronic     Other   Chronic low back pain - Primary   Relevant Medications   naproxen sodium (ANAPROX) tablet   HYDROcodone-acetaminophen (NORCO/VICODIN) 5-325 MG per tablet   Pain management   Perimenopause     Plan:  Meds ordered this encounter  Medications  . naproxen sodium (ANAPROX) 550 MG tablet    Sig: take 1 tablet by mouth twice a day with food    Dispense:  60 tablet    Refill:  2    Order Specific Question:  Supervising Provider    Answer:  Merlyn AlbertLUKING, WILLIAM S [[id number]]  . DISCONTD: HYDROcodone-acetaminophen (NORCO/VICODIN) 5-325 MG per tablet    Sig: Take 1 tablet by mouth 2 (two) times daily as needed.    Dispense:  60 tablet    Refill:  0    Order Specific Question:  Supervising Provider    Answer:  Merlyn AlbertLUKING, WILLIAM S [[id number]]  . DISCONTD: HYDROcodone-acetaminophen (NORCO/VICODIN) 5-325 MG per tablet    Sig: Take 1 tablet by mouth 2 (two) times daily as needed.    Dispense:  60 tablet    Refill:  0    May fill 30 days from 03/02/14    Order Specific Question:  Supervising Provider    Answer:  Merlyn AlbertLUKING, WILLIAM S [[id number]]  . HYDROcodone-acetaminophen (NORCO/VICODIN) 5-325 MG per tablet    Sig: Take 1 tablet by mouth 2 (two) times daily as needed.    Dispense:  60 tablet    Refill:  0    May fill 60 days from  03/02/14    Order Specific Question:  Supervising Provider    Answer:  Merlyn AlbertLUKING, WILLIAM S [[id number]]   Discussed importance of smoking cessation. Recommend physical this year.  Return in about 3 months (around 06/01/2014) for med recheck.

## 2014-06-01 ENCOUNTER — Encounter: Payer: Self-pay | Admitting: Nurse Practitioner

## 2014-06-01 ENCOUNTER — Ambulatory Visit (INDEPENDENT_AMBULATORY_CARE_PROVIDER_SITE_OTHER): Payer: Managed Care, Other (non HMO) | Admitting: Nurse Practitioner

## 2014-06-01 VITALS — BP 132/88 | Ht 63.0 in | Wt 254.0 lb

## 2014-06-01 DIAGNOSIS — L301 Dyshidrosis [pompholyx]: Secondary | ICD-10-CM

## 2014-06-01 DIAGNOSIS — G8929 Other chronic pain: Secondary | ICD-10-CM | POA: Diagnosis not present

## 2014-06-01 DIAGNOSIS — J4521 Mild intermittent asthma with (acute) exacerbation: Secondary | ICD-10-CM | POA: Diagnosis not present

## 2014-06-01 DIAGNOSIS — N920 Excessive and frequent menstruation with regular cycle: Secondary | ICD-10-CM

## 2014-06-01 DIAGNOSIS — M545 Low back pain: Secondary | ICD-10-CM

## 2014-06-01 DIAGNOSIS — J069 Acute upper respiratory infection, unspecified: Secondary | ICD-10-CM

## 2014-06-01 DIAGNOSIS — R52 Pain, unspecified: Secondary | ICD-10-CM

## 2014-06-01 DIAGNOSIS — Z5189 Encounter for other specified aftercare: Secondary | ICD-10-CM

## 2014-06-01 DIAGNOSIS — B9689 Other specified bacterial agents as the cause of diseases classified elsewhere: Secondary | ICD-10-CM

## 2014-06-01 DIAGNOSIS — N951 Menopausal and female climacteric states: Secondary | ICD-10-CM | POA: Diagnosis not present

## 2014-06-01 LAB — POCT HEMOGLOBIN: Hemoglobin: 13.5 g/dL (ref 12.2–16.2)

## 2014-06-01 MED ORDER — PREDNISONE 20 MG PO TABS: ORAL_TABLET | ORAL | Status: DC

## 2014-06-01 MED ORDER — HYDROCODONE-ACETAMINOPHEN 5-325 MG PO TABS: 1.0000 | ORAL_TABLET | Freq: Two times a day (BID) | ORAL | Status: DC | PRN

## 2014-06-01 MED ORDER — ALBUTEROL SULFATE HFA 108 (90 BASE) MCG/ACT IN AERS: 2.0000 | INHALATION_SPRAY | Freq: Four times a day (QID) | RESPIRATORY_TRACT | Status: DC | PRN

## 2014-06-01 MED ORDER — AZITHROMYCIN 250 MG PO TABS: ORAL_TABLET | ORAL | Status: DC

## 2014-06-01 MED ORDER — ALBUTEROL SULFATE (2.5 MG/3ML) 0.083% IN NEBU: INHALATION_SOLUTION | RESPIRATORY_TRACT | Status: DC

## 2014-06-01 MED ORDER — MOMETASONE FUROATE 220 MCG/INH IN AEPB: 2.0000 | INHALATION_SPRAY | Freq: Two times a day (BID) | RESPIRATORY_TRACT | Status: DC

## 2014-06-01 MED ORDER — ALBUTEROL SULFATE HFA 108 (90 BASE) MCG/ACT IN AERS: 2.0000 | INHALATION_SPRAY | RESPIRATORY_TRACT | Status: DC | PRN

## 2014-06-01 MED ORDER — CLOBETASOL PROPIONATE 0.05 % EX OINT: TOPICAL_OINTMENT | CUTANEOUS | Status: DC

## 2014-06-01 NOTE — Patient Instructions (Addendum)
As part of your visit today we have covered your chronic pain. You have been given prescription(s) for pain medicines.The DEA and [facility] Board require that any patient on pain medications must be seen every 3 months. You are expected to come in for a office visit before further pain medications are issued.   We will not refill medications or early nor will we give an extended month supply at the end of these prescriptions.It is your responsibility to keep up with medications. They will not be replaced.  It is your responsibility to schedule an office visit in 3-4 months to be seen before you are out of your medication. Do not call our office to request early refills or additional refills. Do not wait till the last moment to schedule the follow up visit. We highly recommend you schedule this now for 3 months.  We believe that most patients take their meds as prescribed but drug misuse and diversion is a serious problem in the [place]. Our office does standard measures to insure proper care to all. All patients are subject to random urine drug screens and random pill counts. Also all patients drug prescription records are reviewed on a regular basis in accordance with [facility] medical board policies.  Remember, do not use alcohol or illegal drugs with your pain medications.    We are required by law to adhere to strict regulations. Failure on our part to follow these regulations could jeopardize our prescription license which in turn would cause us not to be able to care for you.Thank you for your understanding and following these policies.  Icy hot smart relief TENS unit

## 2014-06-06 ENCOUNTER — Encounter: Payer: Self-pay | Admitting: Nurse Practitioner

## 2014-06-06 DIAGNOSIS — N920 Excessive and frequent menstruation with regular cycle: Secondary | ICD-10-CM | POA: Insufficient documentation

## 2014-06-06 MED ORDER — NORETHINDRONE 0.35 MG PO TABS: 1.0000 | ORAL_TABLET | Freq: Every day | ORAL | Status: DC

## 2014-06-06 NOTE — Progress Notes (Signed)
Subjective:  Presents for routine follow up on back pain. Pain 8-9/10 in the mornings. 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 Very active. Was on her knees scrubbing baseboards a few days ago which has caused a major flare up. Back pain depends on amount of activity. Pain medicine helps her maintain her current level of function. Has done physical therapy in the past. Using heat.  Some relief with Naproxen. Also, now having regular cycles, lasting 3-4 days. Very heavy flow; uses whole box of pads in 3 days. Also has nausea and diarrhea during cycle. Sinus symptoms x 2 weeks. Over past week now developing green mucus. Head congestion. Ear pressure. No fever or sore throat. Occasional cough. Smokes one ppd. Needs refill on asthma meds. Asmanex does not work as well but only med her insurance will cover at this time. Slight flare up of eczema on her hands.  Objective:   BP 132/88 mmHg  Ht  (1.6 m)  Wt 254 lb (115.214 kg)  BMI 45.01 kg/m2 NAD. Alert, oriented. TMs clear effusion. Pharynx injected with green PND noted. Neck supple with mild anterior adenopathy. Lungs BS mildly diminished but clear. Heart RRR. Lower extremities 1+ pitting edema. Significant superficial varicose veins. Few non erythematous papules and dry skin on hands.   Assessment:  Problem List Items Addressed This Visit      Respiratory   Asthma, chronic - Primary   Relevant Medications   albuterol (PROVENTIL) (2.5 MG/3ML) 0.083% nebulizer solution   predniSONE (DELTASONE) 20 MG tablet   albuterol (PROVENTIL HFA;VENTOLIN HFA) 108 (90 BASE) MCG/ACT inhaler   mometasone (ASMANEX 60 METERED DOSES) 220 MCG/INH inhaler     Musculoskeletal and Integument   Dyshidrotic eczema     Other   Chronic low back pain   Relevant Medications   predniSONE (DELTASONE) 20 MG tablet   HYDROcodone-acetaminophen (NORCO/VICODIN) 5-325 MG per tablet   Pain management   Perimenopause   Relevant Orders    POCT hemoglobin (Completed)   Morbid obesity   Menorrhagia with regular cycle    Other Visit Diagnoses    Bacterial upper respiratory infection        Relevant Medications    azithromycin (ZITHROMAX Z-PAK) 250 MG tablet      Plan:  Meds ordered this encounter  Medications  . DISCONTD: albuterol (PROVENTIL HFA;VENTOLIN HFA) 108 (90 BASE) MCG/ACT inhaler    Sig: Inhale 2 puffs into the lungs every 6 (six) hours as needed for wheezing.    Dispense:  1 Inhaler    Refill:  6  . albuterol (PROVENTIL) (2.5 MG/3ML) 0.083% nebulizer solution    Sig: Use via neb q 4 hrs as needed for wheezing    Dispense:  25 vial    Refill:  2  . predniSONE (DELTASONE) 20 MG tablet    Sig: 3 po qd x 3 d then 2 po qd x 3 d then 1 po qd x 3 d    Dispense:  18 tablet    Refill:  0    Order Specific Question:  Supervising Provider    Answer:  Merlyn Albert [[id number]]  . azithromycin (ZITHROMAX Z-PAK) 250 MG tablet    Sig: Take 2 tablets (500 mg) on  Day 1,  followed by 1 tablet (250 mg) once daily on Days 2 through 5.    Dispense:  6 each    Refill:  0    Order Specific Question:  Supervising Provider  Answer:  Merlyn AlbertLUKING, WILLIAM S [[id number]]  . albuterol (PROVENTIL HFA;VENTOLIN HFA) 108 (90 BASE) MCG/ACT inhaler    Sig: Inhale 2 puffs into the lungs every 4 (four) hours as needed for wheezing.    Dispense:  1 Inhaler    Refill:  5    Order Specific Question:  Supervising Provider    Answer:  Merlyn AlbertLUKING, WILLIAM S [[id number]]  . clobetasol ointment (TEMOVATE) 0.05 %    Sig: APPLY TOPICALLY TWICE DAILY AS NEEDED FOR ECZEMA FOR UP TO 2 WEEKS AT A TIME    Dispense:  45 g    Refill:  0    Order Specific Question:  Supervising Provider    Answer:  Merlyn AlbertLUKING, WILLIAM S [[id number]]  . mometasone (ASMANEX 60 METERED DOSES) 220 MCG/INH inhaler    Sig: Inhale 2 puffs into the lungs 2 (two) times daily.    Dispense:  1 Inhaler    Refill:  12    Order Specific Question:  Supervising Provider    Answer:  Merlyn AlbertLUKING, WILLIAM S  [[id number]]  . DISCONTD: HYDROcodone-acetaminophen (NORCO/VICODIN) 5-325 MG per tablet    Sig: Take 1 tablet by mouth 2 (two) times daily as needed.    Dispense:  90 tablet    Refill:  0    May fill 60 days from 06/01/14    Order Specific Question:  Supervising Provider    Answer:  Merlyn AlbertLUKING, WILLIAM S [[id number]]  . DISCONTD: HYDROcodone-acetaminophen (NORCO/VICODIN) 5-325 MG per tablet    Sig: Take 1 tablet by mouth 2 (two) times daily as needed.    Dispense:  90 tablet    Refill:  0    May fill 30 days from 06/01/14    Order Specific Question:  Supervising Provider    Answer:  Merlyn AlbertLUKING, WILLIAM S [[id number]]  . HYDROcodone-acetaminophen (NORCO/VICODIN) 5-325 MG per tablet    Sig: Take 1 tablet by mouth 2 (two) times daily as needed.    Dispense:  90 tablet    Refill:  0    Order Specific Question:  Supervising Provider    Answer:  Merlyn AlbertLUKING, WILLIAM S [[id number]]  . norethindrone (MICRONOR,CAMILA,ERRIN) 0.35 MG tablet    Sig: Take 1 tablet (0.35 mg total) by mouth daily.    Dispense:  1 Package    Refill:  11    Order Specific Question:  Supervising Provider    Answer:  Merlyn AlbertLUKING, WILLIAM S [[id number]]    Call back if heavy menses persist. Discussed importance of weight loss and smoking cessation. Strongly recommend wellness physical. Defers further work up for back pain at this time.  Return in about 3 months (around 08/31/2014) for recheck.

## 2014-06-30 ENCOUNTER — Other Ambulatory Visit: Payer: Self-pay | Admitting: *Deleted

## 2014-06-30 MED ORDER — HYDROCODONE-ACETAMINOPHEN 5-325 MG PO TABS: 1.0000 | ORAL_TABLET | Freq: Three times a day (TID) | ORAL | Status: DC | PRN

## 2014-07-17 ENCOUNTER — Other Ambulatory Visit: Payer: Self-pay | Admitting: Family Medicine

## 2014-08-31 ENCOUNTER — Ambulatory Visit: Payer: Managed Care, Other (non HMO) | Admitting: Nurse Practitioner

## 2014-09-01 ENCOUNTER — Ambulatory Visit (INDEPENDENT_AMBULATORY_CARE_PROVIDER_SITE_OTHER): Payer: Managed Care, Other (non HMO) | Admitting: Nurse Practitioner

## 2014-09-01 VITALS — BP 134/80 | Ht 63.0 in | Wt 254.4 lb

## 2014-09-01 DIAGNOSIS — Z79891 Long term (current) use of opiate analgesic: Secondary | ICD-10-CM

## 2014-09-01 DIAGNOSIS — G4700 Insomnia, unspecified: Secondary | ICD-10-CM

## 2014-09-01 DIAGNOSIS — G8929 Other chronic pain: Secondary | ICD-10-CM

## 2014-09-01 DIAGNOSIS — M545 Low back pain: Secondary | ICD-10-CM

## 2014-09-01 MED ORDER — HYDROCODONE-ACETAMINOPHEN 5-325 MG PO TABS: 1.0000 | ORAL_TABLET | Freq: Three times a day (TID) | ORAL | Status: DC | PRN

## 2014-09-01 MED ORDER — TRAZODONE HCL 50 MG PO TABS: ORAL_TABLET | ORAL | Status: DC

## 2014-09-03 ENCOUNTER — Encounter: Payer: Self-pay | Admitting: Nurse Practitioner

## 2014-09-03 NOTE — Progress Notes (Signed)
Subjective:  Presents for recheck on her chronic low back pain. Has been trying to lose weight. Wearing a fit bit that measures her steps and also monitors her sleep. Has information on her phone regarding her sleep patterns, has not been sleeping well at nighttime. 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 After several nights of minimal sleep, will usually sleep 8 hours or more from sheer exhaustion. Continues to smoke one pack per day. No change. Current pain regimen brings her back pain down to a tolerable level, minimal pain with use of hydrocodone. Does not take more than 3 per day.  Objective:   BP 134/80 mmHg  Ht 5\' 3"  (1.6 m)  Wt 254 lb 6 oz (115.384 kg)  BMI 45.07 kg/m2 NAD. Alert, oriented. Fatigued in appearance. Calm affect. Lungs clear. Heart regular rate rhythm.  Assessment:  Problem List Items Addressed This Visit      Other   Chronic low back pain   Relevant Medications   HYDROcodone-acetaminophen (NORCO/VICODIN) 5-325 MG per tablet   Encounter for long-term opiate analgesic use - Primary   Relevant Orders   ToxASSURE Select 13 (MW), Urine   Insomnia     Plan:  Meds ordered this encounter  Medications  . traZODone (DESYREL) 50 MG tablet    Sig: 1/2 tab po qhs x 6d then one po qhs    Dispense:  30 tablet    Refill:  0    Order Specific Question:  Supervising Provider    Answer:  Merlyn AlbertLUKING, WILLIAM S [[id number]]  . DISCONTD: HYDROcodone-acetaminophen (NORCO/VICODIN) 5-325 MG per tablet    Sig: Take 1 tablet by mouth 3 (three) times daily as needed.    Dispense:  90 tablet    Refill:  0    May fill 30 days from 09/01/14    Order Specific Question:  Supervising Provider    Answer:  Merlyn AlbertLUKING, WILLIAM S [[id number]]  . DISCONTD: HYDROcodone-acetaminophen (NORCO/VICODIN) 5-325 MG per tablet    Sig: Take 1 tablet by mouth 3 (three) times daily as needed.    Dispense:  90 tablet    Refill:  0    May fill 60 days from 09/01/14    Order Specific Question:  Supervising  Provider    Answer:  Merlyn AlbertLUKING, WILLIAM S [[id number]]  . HYDROcodone-acetaminophen (NORCO/VICODIN) 5-325 MG per tablet    Sig: Take 1 tablet by mouth 3 (three) times daily as needed.    Dispense:  90 tablet    Refill:  0    Order Specific Question:  Supervising Provider    Answer:  Merlyn AlbertLUKING, WILLIAM S [[id number]]   Completed control substances agreement and opioid risk tool. Discussed importance of stress reduction. Continue Prozac as directed. Add trazodone as directed for sleep. DC med and call if any adverse effects. Return in about 3 months (around 12/02/2014) for recheck. Reminded patient about preventive health physical.

## 2014-09-06 LAB — TOXASSURE SELECT 13 (MW), URINE: PDF: 0

## 2014-09-15 ENCOUNTER — Ambulatory Visit (INDEPENDENT_AMBULATORY_CARE_PROVIDER_SITE_OTHER): Payer: Managed Care, Other (non HMO) | Admitting: Nurse Practitioner

## 2014-09-15 ENCOUNTER — Encounter: Payer: Self-pay | Admitting: Nurse Practitioner

## 2014-09-15 VITALS — BP 128/88 | Ht 63.0 in | Wt 255.0 lb

## 2014-09-15 DIAGNOSIS — Z79891 Long term (current) use of opiate analgesic: Secondary | ICD-10-CM | POA: Diagnosis not present

## 2014-09-15 DIAGNOSIS — B372 Candidiasis of skin and nail: Secondary | ICD-10-CM | POA: Diagnosis not present

## 2014-09-15 MED ORDER — KETOCONAZOLE 2 % EX CREA: 1.0000 "application " | TOPICAL_CREAM | Freq: Two times a day (BID) | CUTANEOUS | Status: DC

## 2014-09-15 MED ORDER — NAPROXEN SODIUM 550 MG PO TABS: ORAL_TABLET | ORAL | Status: DC

## 2014-09-15 NOTE — Progress Notes (Signed)
Subjective:  Presents for complaints of a burning slightly itchy rash underneath her belly near her C-section scar. Has been applying witch hazel which has helped. No other rash noted. Patient also addressed recent urine screening for routine pain management visit. Had been out of her pain medicines for 2 weeks because of increase dose on some days to 3 per day. Also patient had been doing a detox OTC formula for about a month, has since stopped this. Since stopping the detox she is now taking 1 pain pill per day which is working much better.  Objective:   BP 128/88 mmHg  Ht  (1.6 m)  Wt 255 lb (115.667 kg)  BMI 45.18 kg/m2 NAD. Alert, oriented. Shiny slightly erythematous rash noted under the fold of the lower abdomen more so on the left side.  Assessment: Yeast dermatitis  Plan:  Meds ordered this encounter  Medications  . ketoconazole (NIZORAL) 2 % cream    Sig: Apply 1 application topically 2 (two) times daily. X 2 weeks    Dispense:  60 g    Refill:  0    Order Specific Question:  Supervising Provider    Answer:  Merlyn Albert [[id number]]  . naproxen sodium (ANAPROX) 550 MG tablet    Sig: take 1 tablet by mouth twice a day with food    Dispense:  60 tablet    Refill:  2    Order Specific Question:  Supervising Provider    Answer:  Riccardo Dubin   Reviewed addiction potential for hydrocodone. Limit use as much as possible no more than 3 per day. Repeat urine drug screen at next pain management appointment. Call back if rash persists.

## 2014-10-20 ENCOUNTER — Encounter: Payer: Self-pay | Admitting: Family Medicine

## 2014-10-20 ENCOUNTER — Ambulatory Visit (INDEPENDENT_AMBULATORY_CARE_PROVIDER_SITE_OTHER): Payer: Managed Care, Other (non HMO) | Admitting: Family Medicine

## 2014-10-20 VITALS — BP 132/84 | Temp 98.2°F | Ht 63.0 in | Wt 254.2 lb

## 2014-10-20 DIAGNOSIS — J301 Allergic rhinitis due to pollen: Secondary | ICD-10-CM

## 2014-10-20 DIAGNOSIS — J019 Acute sinusitis, unspecified: Secondary | ICD-10-CM

## 2014-10-20 DIAGNOSIS — B9689 Other specified bacterial agents as the cause of diseases classified elsewhere: Secondary | ICD-10-CM

## 2014-10-20 MED ORDER — AMOXICILLIN-POT CLAVULANATE 875-125 MG PO TABS: 1.0000 | ORAL_TABLET | Freq: Two times a day (BID) | ORAL | Status: DC

## 2014-10-20 MED ORDER — PREDNISONE 20 MG PO TABS: ORAL_TABLET | ORAL | Status: DC

## 2014-10-20 NOTE — Progress Notes (Signed)
   Subjective:    Patient ID: Megan Parker, female    DOB: 1965-11-21, 49 y.o.   MRN: [id number]  Sinusitis This is a new problem. The current episode started in the past 7 days. The problem has been gradually worsening since onset. There has been no fever. Associated symptoms include congestion, coughing, ear pain, headaches, sinus pressure, sneezing and a sore throat. Pertinent negatives include no shortness of breath. (Productive cough with green phelm) Past treatments include oral decongestants. The treatment provided no relief.   Patient states no other concerns this visit.   Review of Systems  Constitutional: Negative for fever and activity change.  HENT: Positive for congestion, ear pain, rhinorrhea, sinus pressure, sneezing and sore throat.   Eyes: Negative for discharge.  Respiratory: Positive for cough. Negative for shortness of breath and wheezing.   Cardiovascular: Negative for chest pain.  Neurological: Positive for headaches.       Objective:   Physical Exam  Constitutional: She appears well-developed.  HENT:  Head: Normocephalic.  Nose: Nose normal.  Mouth/Throat: Oropharynx is clear and moist. No oropharyngeal exudate.  Neck: Neck supple.  Cardiovascular: Normal rate and normal heart sounds.   No murmur heard. Pulmonary/Chest: Effort normal. She has wheezes.  Lymphadenopathy:    She has no cervical adenopathy.  Skin: Skin is warm and dry.  Nursing note and vitals reviewed.         Assessment & Plan:  Viral syndrome secondary sinusitis mild respiratory effect-go forward with antibiotics. In addition to this albuterol. In also prednisone taper. Patient follow-up if ongoing troubles I do not feel patient needs lab work or x-rays currently

## 2014-10-27 ENCOUNTER — Other Ambulatory Visit: Payer: Self-pay | Admitting: *Deleted

## 2014-10-27 ENCOUNTER — Telehealth: Payer: Self-pay | Admitting: Family Medicine

## 2014-10-27 MED ORDER — LEVOFLOXACIN 500 MG PO TABS: 500.0000 mg | ORAL_TABLET | Freq: Every day | ORAL | Status: DC

## 2014-10-27 MED ORDER — PREDNISONE 20 MG PO TABS: ORAL_TABLET | ORAL | Status: DC

## 2014-10-27 NOTE — Telephone Encounter (Signed)
[facility] - meds have been sent to pharm.

## 2014-10-27 NOTE — Telephone Encounter (Signed)
Pt not better. Has one day left on prednisone and 2 days left on augmentin. Using inhaler and doing neb treatments four times a day. Wheezing, fever off and on, coughing up green sputum, sinus congestion,

## 2014-10-27 NOTE — Telephone Encounter (Signed)
Pt.notified

## 2014-10-27 NOTE — Telephone Encounter (Signed)
Rep pred taper, levaquin 500 qd for ten d, re ck in office if no better next several days

## 2014-10-27 NOTE — Telephone Encounter (Signed)
Pt seen last week, was told by Dr Lorin Picket if not better in 72 hrs if not better Or improved to call back. She has been using the albuterol breathing treatments Using her inhaler, amoxicillin-clavulanate (AUGMENTIN) 875-125 MG per tablet Was also issued, nothing seems to be working. She has severe cough, SOB, tightening  Of her chest, low grade intermittent, loss of appetite.   [facility]

## 2014-10-28 ENCOUNTER — Telehealth: Payer: Self-pay | Admitting: Family Medicine

## 2014-10-28 MED ORDER — TRAZODONE HCL 50 MG PO TABS: ORAL_TABLET | ORAL | Status: DC

## 2014-10-28 MED ORDER — AZITHROMYCIN 250 MG PO TABS: ORAL_TABLET | ORAL | Status: DC

## 2014-10-28 NOTE — Telephone Encounter (Signed)
Patient had levofloxacin (LEVAQUIN) 500 MG tablet called in for her yesterday for the rhinosinusitis she was diagnosed with.  She said that pharmacist would not fill it for her because she had an allergic reaction to this in 2008. She is unsure about what happened when she had this reaction, but she would like it noted on her chart.  She also is requesting another antibiotic to be called in.   [facility]

## 2014-10-28 NOTE — Telephone Encounter (Signed)
Spoke with patient and informed her per Dr.Scott- Levaquin was added to her allergy list and that the med was is D/C'd, also informed patient that Zpak was ordered and 5 refills were sent in for Trazadone. Informed patient if ongoing issues to please follow up with the office. Patient verbalized understanding.

## 2014-10-28 NOTE — Telephone Encounter (Signed)
Pt is also requesting a refill on her trazodone.  [facility]

## 2014-10-28 NOTE — Telephone Encounter (Signed)
1- please note it as an allergy med 2-d/c levaquin order, 3- Rx Zpack, 4- rf trazadone with 5 refills 5- pt to f/u if ongoing

## 2014-12-02 ENCOUNTER — Ambulatory Visit (INDEPENDENT_AMBULATORY_CARE_PROVIDER_SITE_OTHER): Payer: BLUE CROSS/BLUE SHIELD | Admitting: Nurse Practitioner

## 2014-12-02 ENCOUNTER — Encounter: Payer: Self-pay | Admitting: Nurse Practitioner

## 2014-12-02 VITALS — BP 122/80 | Ht 63.0 in | Wt 266.0 lb

## 2014-12-02 DIAGNOSIS — G8929 Other chronic pain: Secondary | ICD-10-CM

## 2014-12-02 DIAGNOSIS — M545 Low back pain: Secondary | ICD-10-CM

## 2014-12-02 DIAGNOSIS — Z79891 Long term (current) use of opiate analgesic: Secondary | ICD-10-CM

## 2014-12-02 MED ORDER — HYDROCODONE-ACETAMINOPHEN 5-325 MG PO TABS: 1.0000 | ORAL_TABLET | Freq: Three times a day (TID) | ORAL | Status: DC | PRN

## 2014-12-02 MED ORDER — HYDROCODONE-ACETAMINOPHEN 5-325 MG PO TABS
1.0000 | ORAL_TABLET | Freq: Three times a day (TID) | ORAL | Status: DC | PRN
Start: 2014-12-02 — End: 2014-12-02

## 2014-12-02 MED ORDER — PHENTERMINE HCL 37.5 MG PO TABS: 37.5000 mg | ORAL_TABLET | Freq: Every day | ORAL | Status: DC

## 2014-12-03 ENCOUNTER — Encounter: Payer: Self-pay | Admitting: Nurse Practitioner

## 2014-12-03 NOTE — Progress Notes (Signed)
Subjective:  Presents for routine follow-up on her pain medication. Continues to work long hours. Takes a pain pill in the morning and one after work with one sometimes if needed at nighttime. Has not done very well with her diet. Has put on weight. Was recently denied short-term disability coverage due to her level of obesity. Would like to try phentermine again for weight loss, has been off of this for years. Also complaints of slight ear pressure. Had a recent cold which is resolving. This patient was seen today for chronic pain  The medication list was reviewed and updated.   -Compliance with medication: yes  - Number patient states they take daily: 2-3  -when was the last dose patient took? This am  The patient was advised the importance of maintaining medication and not using illegal substances with these.  Refills needed: yes  The patient was educated that we can provide 3 monthly scripts for their medication, it is their responsibility to follow the instructions.  Side effects or complications from medications: none  Patient is aware that pain medications are meant to minimize the severity of the pain to allow their pain levels to improve to allow for better function. They are aware of that pain medications cannot totally remove their pain.  Due for UDT ( at least once per year) : done   Objective:   BP 122/80 mmHg  Ht  (1.6 m)  Wt 266 lb (120.657 kg)  BMI 47.13 kg/m2 NAD. Alert, oriented. TMs clear effusion, no erythema. Pharynx clear. Neck supple with mild soft anterior adenopathy. Lungs clear. Heart regular rate rhythm. Significant central obesity noted.  Assessment:  Problem List Items Addressed This Visit      Other   Chronic low back pain   Relevant Medications   HYDROcodone-acetaminophen (NORCO/VICODIN) 5-325 MG tablet   Encounter for long-term opiate analgesic use - Primary   Morbid obesity (HCC)   Relevant Medications   phentermine (ADIPEX-P) 37.5 MG  tablet     Plan:  Meds ordered this encounter  Medications  . medroxyPROGESTERone (PROVERA) 10 MG tablet    Sig: Take 10 mg by mouth as needed.  . phentermine (ADIPEX-P) 37.5 MG tablet    Sig: Take 1 tablet (37.5 mg total) by mouth daily before breakfast.    Dispense:  30 tablet    Refill:  2    Order Specific Question:  Supervising Provider    Answer:  Merlyn Albert [[id number]]  . DISCONTD: HYDROcodone-acetaminophen (NORCO/VICODIN) 5-325 MG tablet    Sig: Take 1 tablet by mouth 3 (three) times daily as needed.    Dispense:  90 tablet    Refill:  0    Order Specific Question:  Supervising Provider    Answer:  Merlyn Albert [[id number]]  . DISCONTD: HYDROcodone-acetaminophen (NORCO/VICODIN) 5-325 MG tablet    Sig: Take 1 tablet by mouth 3 (three) times daily as needed.    Dispense:  90 tablet    Refill:  0    May fill 30 days from 12/02/14    Order Specific Question:  Supervising Provider    Answer:  Merlyn Albert [[id number]]  . HYDROcodone-acetaminophen (NORCO/VICODIN) 5-325 MG tablet    Sig: Take 1 tablet by mouth 3 (three) times daily as needed.    Dispense:  90 tablet    Refill:  0    May fill 60 days from 12/02/14    Order Specific Question:  Supervising Provider    Answer:  Gerda Diss,  Chrissie NoaWILLIAM S [[id number]]   Restart phentermine as directed. DC med and call if any problems. Patient understands this is only a temporary solution to her weight issues. Strongly encourage her to look into bariatric surgery or weight management Center. Also strongly encouraged to preventive health physical.

## 2014-12-09 ENCOUNTER — Telehealth: Payer: Self-pay | Admitting: Nurse Practitioner

## 2014-12-09 ENCOUNTER — Other Ambulatory Visit: Payer: Self-pay | Admitting: Nurse Practitioner

## 2014-12-09 MED ORDER — AMOXICILLIN-POT CLAVULANATE 875-125 MG PO TABS: 1.0000 | ORAL_TABLET | Freq: Two times a day (BID) | ORAL | Status: DC

## 2014-12-09 NOTE — Telephone Encounter (Signed)
Pt called stating that her sinus inf is getting worse and now she is coughing with it as well. Pt would like for something to be called in to [facility] [place]

## 2014-12-09 NOTE — Telephone Encounter (Signed)
Patient notified

## 2014-12-09 NOTE — Telephone Encounter (Signed)
Called in antibiotic. Call back if persists.

## 2014-12-21 ENCOUNTER — Other Ambulatory Visit: Payer: Self-pay | Admitting: Nurse Practitioner

## 2015-01-06 ENCOUNTER — Encounter: Payer: Self-pay | Admitting: Family Medicine

## 2015-01-06 ENCOUNTER — Ambulatory Visit (INDEPENDENT_AMBULATORY_CARE_PROVIDER_SITE_OTHER): Payer: BLUE CROSS/BLUE SHIELD | Admitting: Family Medicine

## 2015-01-06 VITALS — BP 130/80 | Ht 63.0 in | Wt 254.4 lb

## 2015-01-06 DIAGNOSIS — M7582 Other shoulder lesions, left shoulder: Secondary | ICD-10-CM

## 2015-01-06 DIAGNOSIS — M778 Other enthesopathies, not elsewhere classified: Secondary | ICD-10-CM

## 2015-01-06 DIAGNOSIS — Z720 Tobacco use: Secondary | ICD-10-CM

## 2015-01-06 MED ORDER — MELOXICAM 15 MG PO TABS: 15.0000 mg | ORAL_TABLET | Freq: Every day | ORAL | Status: DC

## 2015-01-06 MED ORDER — BUPROPION HCL ER (SR) 150 MG PO TB12: 150.0000 mg | ORAL_TABLET | Freq: Two times a day (BID) | ORAL | Status: DC

## 2015-01-06 MED ORDER — HYDROCODONE-ACETAMINOPHEN 10-325 MG PO TABS: 1.0000 | ORAL_TABLET | Freq: Four times a day (QID) | ORAL | Status: DC | PRN

## 2015-01-06 NOTE — Progress Notes (Signed)
   Subjective:    Patient ID: Megan Parker, female    DOB: 11/13/1965, 49 y.o.   MRN: [id number]  Shoulder Pain  The pain is present in the left shoulder. This is a new problem. The current episode started in the past 7 days. The problem occurs intermittently. The problem has been unchanged. The quality of the pain is described as burning. The pain is moderate. Associated symptoms include numbness. The symptoms are aggravated by activity. She has tried heat (hydrocodone) for the symptoms. The treatment provided no relief.   she states this past Saturday she strained her shoulder trying to reach tried washing machine pelvic pain and discomfort every day since then Patient wants to discuss options for smoking cessation.   patient states she has tried cold [place] unable to do so because of depression and previous problems with medication does not want to use Chantix  Review of Systems  Neurological: Positive for numbness.    decreased range of motion some upper chest upper trapezius pain intermittent numbness in the    Objective:   Physical Exam   left shoulder tendinitis with tenderness decreased range of motion no obvious rotator cuff tear  patient does not exhibit any major weakness in the arm except for the shoulder muscles some tenderness which makes it harder for her to use the shoulder     Assessment & Plan:   left rotator cuff tendinitis recommend stretching exercises range of motion exercises and anti-inflammatory 1 per day mobility. In addition to this we will go ahead with stronger pain medication. Hydrocodone 10 mg 1 every 6 hours when necessary severe pain caution drowsiness only use half this dose during the day when how about. After the next 2 weeks patient needs to go back to her standard amount on pain medicine.   if patient does not get better with the exercises of medication in the next 2 weeks notify us and we will help set up with orthopedics   I recommend well putrid twice  daily over the next 3 months to help her with quitting smoking. It causes any side effects stop medicine

## 2015-01-19 ENCOUNTER — Ambulatory Visit (INDEPENDENT_AMBULATORY_CARE_PROVIDER_SITE_OTHER): Payer: BLUE CROSS/BLUE SHIELD | Admitting: Nurse Practitioner

## 2015-01-19 ENCOUNTER — Encounter: Payer: Self-pay | Admitting: Family Medicine

## 2015-01-19 ENCOUNTER — Encounter: Payer: Self-pay | Admitting: Nurse Practitioner

## 2015-01-19 VITALS — BP 136/92 | Temp 98.3°F | Ht 63.0 in | Wt 251.0 lb

## 2015-01-19 DIAGNOSIS — J329 Chronic sinusitis, unspecified: Secondary | ICD-10-CM

## 2015-01-19 DIAGNOSIS — J310 Chronic rhinitis: Secondary | ICD-10-CM

## 2015-01-19 MED ORDER — AZITHROMYCIN 250 MG PO TABS: ORAL_TABLET | ORAL | Status: DC

## 2015-01-19 NOTE — Patient Instructions (Signed)
Nasacort AQ as directed 

## 2015-01-22 ENCOUNTER — Encounter: Payer: Self-pay | Admitting: Nurse Practitioner

## 2015-01-22 NOTE — Progress Notes (Signed)
Subjective:  Presents for complaints of sinus symptoms over the past week. No fever. No headache. Sore throat. Runny nose. Frequent cough producing green mucus. No wheezing. Has not used her albuterol since her last visit. Has decreased her amount of cigarettes per day, trying to quit by weaning off.  Objective:   BP 136/92 mmHg  Temp(Src) 98.3 F (36.8 C) (Oral)  Ht 5\' 3"  (1.6 m)  Wt 251 lb (113.853 kg)  BMI 44.47 kg/m2 NAD. Alert, oriented. TMs clear effusion, no erythema. Obvious head congestion noted. Pharynx injected with PND noted. Neck supple with mild soft anterior adenopathy. Lungs clear. Heart regular rate rhythm.  Assessment: Rhinosinusitis  Plan:  Meds ordered this encounter  Medications  . azithromycin (ZITHROMAX Z-PAK) 250 MG tablet    Sig: Take 2 tablets (500 mg) on  Day 1,  followed by 1 tablet (250 mg) once daily on Days 2 through 5.    Dispense:  6 each    Refill:  0    Order Specific Question:  Supervising Provider    Answer:  Merlyn AlbertLUKING, WILLIAM S [[id number]]   OTC meds as directed for congestion and cough. Callback in 7-10 days if no improvement, sooner if worse.

## 2015-03-05 ENCOUNTER — Encounter: Payer: Self-pay | Admitting: Nurse Practitioner

## 2015-03-05 ENCOUNTER — Ambulatory Visit (INDEPENDENT_AMBULATORY_CARE_PROVIDER_SITE_OTHER): Payer: BLUE CROSS/BLUE SHIELD | Admitting: Nurse Practitioner

## 2015-03-05 VITALS — BP 130/90 | Ht 63.0 in | Wt 252.0 lb

## 2015-03-05 DIAGNOSIS — Z79891 Long term (current) use of opiate analgesic: Secondary | ICD-10-CM | POA: Diagnosis not present

## 2015-03-05 DIAGNOSIS — R52 Pain, unspecified: Secondary | ICD-10-CM

## 2015-03-05 DIAGNOSIS — Z5189 Encounter for other specified aftercare: Secondary | ICD-10-CM | POA: Diagnosis not present

## 2015-03-05 MED ORDER — PHENTERMINE HCL 37.5 MG PO TABS: 37.5000 mg | ORAL_TABLET | Freq: Every day | ORAL | Status: DC

## 2015-03-05 MED ORDER — HYDROCODONE-ACETAMINOPHEN 5-325 MG PO TABS: 1.0000 | ORAL_TABLET | Freq: Three times a day (TID) | ORAL | Status: DC | PRN

## 2015-03-05 NOTE — Patient Instructions (Addendum)
Zaditor eye drops as directed  Does insurance cover bariatric surgery? What do they cover? Sleeve? Gastric bypass? Who is on provider list? Chose provider; contact them for information session

## 2015-03-05 NOTE — Progress Notes (Signed)
Subjective:  This patient was seen today for chronic pain  The medication list was reviewed and updated.   -Compliance with medication: yes  - Number patient states they take daily: 0-3 times per day  -when was the last dose patient took? This afternoon  The patient was advised the importance of maintaining medication and not using illegal substances with these.  Refills needed: yes  The patient was educated that we can provide 3 monthly scripts for their medication, it is their responsibility to follow the instructions.  Side effects or complications from medications: none   Patient is aware that pain medications are meant to minimize the severity of the pain to allow their pain levels to improve to allow for better function. They are aware of that pain medications cannot totally remove their pain.  Due for UDT ( at least once per year) : 09/01/14    Wants to decrease the strength of her hydrocodone back to 5 mg.  Also has done well on phentermine, would like to continue for a few more months. Denies any adverse effects.     Objective:   BP 130/90 mmHg  Ht 5\' 3"  (1.6 m)  Wt 252 lb (114.306 kg)  BMI 44.65 kg/m2  NAD. Alert, oriented. Lungs clear. Heart regular rate rhythm.  Assessment:  Problem List Items Addressed This Visit      Other   Encounter for long-term opiate analgesic use - Primary   Morbid obesity (HCC)   Relevant Medications   phentermine (ADIPEX-P) 37.5 MG tablet   Pain management     Plan:  Meds ordered this encounter  Medications  . phentermine (ADIPEX-P) 37.5 MG tablet    Sig: Take 1 tablet (37.5 mg total) by mouth daily before breakfast.    Dispense:  30 tablet    Refill:  2    Order Specific Question:  Supervising Provider    Answer:  Merlyn AlbertLUKING, WILLIAM S [[id number]]  . DISCONTD: HYDROcodone-acetaminophen (NORCO/VICODIN) 5-325 MG tablet    Sig: Take 1 tablet by mouth 3 (three) times daily as needed for moderate pain.    Dispense:  60 tablet    Refill:   0    Order Specific Question:  Supervising Provider    Answer:  Merlyn AlbertLUKING, WILLIAM S [[id number]]  . DISCONTD: HYDROcodone-acetaminophen (NORCO/VICODIN) 5-325 MG tablet    Sig: Take 1 tablet by mouth 3 (three) times daily as needed for moderate pain.    Dispense:  60 tablet    Refill:  0    May fill 30 days from 03/05/15    Order Specific Question:  Supervising Provider    Answer:  Merlyn AlbertLUKING, WILLIAM S [[id number]]  . HYDROcodone-acetaminophen (NORCO/VICODIN) 5-325 MG tablet    Sig: Take 1 tablet by mouth 3 (three) times daily as needed for moderate pain.    Dispense:  60 tablet    Refill:  0    May fill 60 days from 03/05/15    Order Specific Question:  Supervising Provider    Answer:  Merlyn AlbertLUKING, WILLIAM S [[id number]]    has a very active job. Discussed dietary measures. Also discussed bariatric surgery. Return in about 3 months (around 06/03/2015) for recheck.

## 2015-03-08 ENCOUNTER — Encounter: Payer: Self-pay | Admitting: Nurse Practitioner

## 2015-03-26 ENCOUNTER — Other Ambulatory Visit: Payer: Self-pay | Admitting: Nurse Practitioner

## 2015-03-26 ENCOUNTER — Telehealth: Payer: Self-pay | Admitting: Family Medicine

## 2015-03-26 MED ORDER — AZITHROMYCIN 250 MG PO TABS: ORAL_TABLET | ORAL | Status: DC

## 2015-03-26 NOTE — Progress Notes (Signed)
Left message to return call 

## 2015-03-26 NOTE — Telephone Encounter (Signed)
Pt called stating that everyone where she works has an upper respiratory infection and was told be carolyn that she doesn't want her on steroids to call before it gets too bad. Pt states that her chest is tight,she has a cough,and sore throat. Pt wants to know if Eber Jones wants to see her or if she wants to call something in.     [facility]

## 2015-03-26 NOTE — Progress Notes (Signed)
See phone note. Will send in Rx for Zpack. Feel this is most likely viral with several coworkers being sick. Hold on Zpack and start over weekend if needed.

## 2015-03-26 NOTE — Progress Notes (Signed)
Notified patient Megan Parker will send in Rx for Zpack. Feel this is most likely viral with several coworkers being sick. Hold on Zpack and start over weekend if needed. Patient verbalized understanding.

## 2015-03-26 NOTE — Telephone Encounter (Signed)
Fever? Wheezing? Color to mucus?

## 2015-03-26 NOTE — Telephone Encounter (Signed)
No fever, no wheezing, not much congestion coming up

## 2015-03-26 NOTE — Telephone Encounter (Signed)
Pt checking on this wanting to pick up the med before she goes home please

## 2015-03-26 NOTE — Telephone Encounter (Signed)
Patient has sore throat, chest tightness, chest congestion and cough. No other symptoms. Started last night.

## 2015-04-20 ENCOUNTER — Ambulatory Visit (INDEPENDENT_AMBULATORY_CARE_PROVIDER_SITE_OTHER): Payer: BLUE CROSS/BLUE SHIELD

## 2015-04-20 ENCOUNTER — Encounter: Payer: Self-pay | Admitting: Orthopaedic Surgery

## 2015-04-20 ENCOUNTER — Ambulatory Visit (INDEPENDENT_AMBULATORY_CARE_PROVIDER_SITE_OTHER): Payer: BLUE CROSS/BLUE SHIELD | Admitting: Orthopaedic Surgery

## 2015-04-20 VITALS — BP 145/86 | HR 80 | Temp 98.1°F | Ht 63.0 in | Wt 244.4 lb

## 2015-04-20 DIAGNOSIS — M79671 Pain in right foot: Secondary | ICD-10-CM

## 2015-04-20 DIAGNOSIS — M25571 Pain in right ankle and joints of right foot: Secondary | ICD-10-CM | POA: Diagnosis not present

## 2015-04-20 NOTE — Progress Notes (Addendum)
Megan Parker, female DOB:[birth year], 50 y.o. EAV:[id number]  Chief Complaint  Megan presents with  . Foot Pain    Right foot pain "whole foot hurts keeping me up at night"    HPI  Megan Parker is a 50 y.o. female who has pain of the right foot and ankle.  She has had pain for about two weeks.  She has no trauma.  She has history of right ankle and foot pain in the past but it had gotten better.  She has no redness, no giving way.  She has lateral swelling of the ankle.  She has no ecchymosis.  She has been limping and using ice.  She is not better, she feels she is worse.  She says the pain is sharp and throbbing.  Nothing seems to make the ankle better.  HPI  Body mass index is 43.3 kg/(m^2).  Review of Systems  Constitutional:       Megan does not have Diabetes Mellitus. Megan does not have hypertension. Megan does not have COPD or shortness of breath. Megan has BMI > 35. Megan has current smoking history.  HENT: Negative for congestion.   Respiratory: Negative for cough and shortness of breath.   Cardiovascular: Negative for chest pain.  Endocrine: Negative for cold intolerance.  Musculoskeletal: Positive for joint swelling, arthralgias and gait problem.  Allergic/Immunologic: Negative for environmental allergies.    No past medical history on file.  Past Surgical History  Procedure Laterality Date  . Cholecystectomy    . Tubal ligation      Family History  Problem Relation Age of Onset  . Thyroid disease Mother   . Heart attack Father   . Hyperlipidemia Father   . Thyroid disease Sister     Social History Social History  Substance Use Topics  . Smoking status: Current Every Day Smoker -- 1.00 packs/day for 26 years  . Smokeless tobacco: Not on file  . Alcohol Use: No    Allergies  Allergen Reactions  . Codeine Itching  . Levaquin [Levofloxacin] Other (See Comments)    Reaction unknown to Megan     Current Outpatient Prescriptions   Medication Sig Dispense Refill  . albuterol (PROVENTIL HFA;VENTOLIN HFA) 108 (90 BASE) MCG/ACT inhaler Inhale 2 puffs into the lungs every 4 (four) hours as needed for wheezing. 1 Inhaler 5  . albuterol (PROVENTIL) (2.5 MG/3ML) 0.083% nebulizer solution Use via neb q 4 hrs as needed for wheezing 25 vial 2  . azithromycin (ZITHROMAX Z-PAK) 250 MG tablet Take 2 tablets (500 mg) on  Day 1,  followed by 1 tablet (250 mg) once daily on Days 2 through 5. 6 each 0  . clobetasol ointment (TEMOVATE) 0.05 % APPLY TOPICALLY TWICE DAILY AS NEEDED FOR ECZEMA FOR UP TO 2 WEEKS AT A TIME 45 g 0  . FLUoxetine (PROZAC) 20 MG capsule TAKE TWO CAPSULES ONCE DAILY 60 capsule 5  . HYDROcodone-acetaminophen (NORCO/VICODIN) 5-325 MG tablet Take 1 tablet by mouth 3 (three) times daily as needed for moderate pain. 60 tablet 0  . medroxyPROGESTERone (PROVERA) 10 MG tablet Take 10 mg by mouth as needed.    . meloxicam (MOBIC) 15 MG tablet Take 1 tablet (15 mg total) by mouth daily. 30 tablet 2  . mometasone (ASMANEX 60 METERED DOSES) 220 MCG/INH inhaler Inhale 2 puffs into the lungs 2 (two) times daily. 1 Inhaler 12  . naproxen sodium (ANAPROX) 550 MG tablet   1  . phentermine (ADIPEX-P) 37.5 MG  tablet Take 1 tablet (37.5 mg total) by mouth daily before breakfast. 30 tablet 2  . traZODone (DESYREL) 50 MG tablet 1/2 tab po qhs x 6d then one po qhs 30 tablet 5   No current facility-administered medications for this visit.     Physical Exam  Blood pressure 145/86, pulse 80, temperature 98.1 F (36.7 C), height  (1.6 m), weight 244 lb 6.4 oz (110.859 kg).  Constitutional: overall normal hygiene, normal nutrition, well developed, normal grooming, normal body habitus. Assistive device:none  Musculoskeletal: gait and station Limp right, muscle tone and strength are normal, no tremors or atrophy is present.  Marland Kitchen Megan Parker Neurological: coordination overall normal.  Deep tendon reflex/nerve stretch intact.  Sensation  normal.  Cranial nerves II-XII intact.   Skin:   normal overall no scars, lesions, ulcers or rashes. No psoriasis.  Psychiatric: Alert and oriented x 3.  Recent memory intact, remote memory unclear.  Normal mood and affect. Well groomed.  Good eye contact.  Cardiovascular: overall no swelling, no varicosities, no edema bilaterally, normal temperatures of the legs and arms, no clubbing, cyanosis and good capillary refill.  Lymphatic: palpation is normal.   Extremities:right foot and ankle are tender, more over the lateral right ankle and the dorsum of the right foot.  She has swelling laterally of the ankle and some tenderness of the anterior talofibular ligament.  There is no redness.  There is no ecchymosis.  Left ankle is normal as is the left foot.  She has a slight rash of the medial side of the right foot. Inspection per above Strength and tone bilaterally equal of both lower extremities Range of motion full of both ankles and toes  Additional services performed: x-rays were done of the right foot and ankle.  See separate report.  I have ordered and fitted her with a CAM walker for the right foot/ankle.   I have given instructions and sheet about Contrast Baths.  I have talked to her about stopping smoking or decreasing.  She is not willing to do so now.  The Megan has been educated about the nature of the problem(s) and counseled on treatment options.  The Megan appeared to understand what I have discussed and is in agreement with it. Encounter Diagnoses  Name Primary?  . Right foot pain Yes  . Right ankle pain     PLAN Call if any problems.  Precautions discussed.  Continue current medications.   Return to clinic 1 week

## 2015-04-20 NOTE — Patient Instructions (Addendum)
Work note- no prolonged walking or standing  Use CAM walker.  Do contrast baths.

## 2015-04-21 ENCOUNTER — Telehealth: Payer: Self-pay | Admitting: Orthopaedic Surgery

## 2015-04-21 NOTE — Telephone Encounter (Signed)
Patient called asking if her out of work note could be revised to cover for today. She didn't go to work today because her foot was hurting bad, but will go back tomorrow.

## 2015-04-21 NOTE — Telephone Encounter (Signed)
ok 

## 2015-04-22 ENCOUNTER — Encounter: Payer: Self-pay | Admitting: Orthopaedic Surgery

## 2015-04-27 ENCOUNTER — Ambulatory Visit (INDEPENDENT_AMBULATORY_CARE_PROVIDER_SITE_OTHER): Payer: BLUE CROSS/BLUE SHIELD | Admitting: Orthopaedic Surgery

## 2015-04-27 ENCOUNTER — Encounter: Payer: Self-pay | Admitting: Orthopaedic Surgery

## 2015-04-27 VITALS — BP 124/88 | HR 91 | Temp 97.9°F | Resp 16 | Ht 63.0 in | Wt 243.0 lb

## 2015-04-27 DIAGNOSIS — M79671 Pain in right foot: Secondary | ICD-10-CM

## 2015-04-27 NOTE — Progress Notes (Signed)
Patient ZO:XWRU:Megan Parker, female DOB:12/27/1965, 50 y.o. EAV:[id number]  Chief Complaint  Patient presents with  . Follow-up    follow up right foot    HPI  Megan Parker is a 50 y.o. female who has had right foot and ankle pain. She is much improved.  She is wearing regular shoes and walking well.  She is pleased  HPI  Body mass index is 43.06 kg/(m^2).   Review of Systems  Constitutional:       Patient does not have Diabetes Mellitus. Patient does not have hypertension. Patient does not have COPD or shortness of breath. Patient has BMI > 35. Patient has current smoking history.    No past medical history on file.  Past Surgical History  Procedure Laterality Date  . Cholecystectomy    . Tubal ligation      Family History  Problem Relation Age of Onset  . Thyroid disease Mother   . Heart attack Father   . Hyperlipidemia Father   . Thyroid disease Sister     Social History Social History  Substance Use Topics  . Smoking status: Current Every Day Smoker -- 1.00 packs/day for 26 years  . Smokeless tobacco: None  . Alcohol Use: No    Allergies  Allergen Reactions  . Codeine Itching  . Levaquin [Levofloxacin] Other (See Comments)    Reaction unknown to patient     Current Outpatient Prescriptions  Medication Sig Dispense Refill  . albuterol (PROVENTIL HFA;VENTOLIN HFA) 108 (90 BASE) MCG/ACT inhaler Inhale 2 puffs into the lungs every 4 (four) hours as needed for wheezing. 1 Inhaler 5  . albuterol (PROVENTIL) (2.5 MG/3ML) 0.083% nebulizer solution Use via neb q 4 hrs as needed for wheezing 25 vial 2  . azithromycin (ZITHROMAX Z-PAK) 250 MG tablet Take 2 tablets (500 mg) on  Day 1,  followed by 1 tablet (250 mg) once daily on Days 2 through 5. 6 each 0  . clobetasol ointment (TEMOVATE) 0.05 % APPLY TOPICALLY TWICE DAILY AS NEEDED FOR ECZEMA FOR UP TO 2 WEEKS AT A TIME 45 g 0  . FLUoxetine (PROZAC) 20 MG capsule TAKE TWO CAPSULES ONCE DAILY 60 capsule 5  .  HYDROcodone-acetaminophen (NORCO/VICODIN) 5-325 MG tablet Take 1 tablet by mouth 3 (three) times daily as needed for moderate pain. 60 tablet 0  . medroxyPROGESTERone (PROVERA) 10 MG tablet Take 10 mg by mouth as needed.    . meloxicam (MOBIC) 15 MG tablet Take 1 tablet (15 mg total) by mouth daily. 30 tablet 2  . mometasone (ASMANEX 60 METERED DOSES) 220 MCG/INH inhaler Inhale 2 puffs into the lungs 2 (two) times daily. 1 Inhaler 12  . naproxen sodium (ANAPROX) 550 MG tablet   1  . phentermine (ADIPEX-P) 37.5 MG tablet Take 1 tablet (37.5 mg total) by mouth daily before breakfast. 30 tablet 2  . traZODone (DESYREL) 50 MG tablet 1/2 tab po qhs x 6d then one po qhs 30 tablet 5   No current facility-administered medications for this visit.     Physical Exam  Blood pressure 124/88, pulse 91, temperature 97.9 F (36.6 C), resp. rate 16, height 5\' 3"  (1.6 m), weight 243 lb (110.224 kg).  Constitutional: overall normal hygiene, normal nutrition, well developed, normal grooming, normal body habitus. Assistive device:none  Musculoskeletal: gait and station Limp none, muscle tone and strength are normal, no tremors or atrophy is present.  .  Neurological: coordination overall normal.  Deep tendon reflex/nerve stretch intact.  Sensation normal.  Cranial nerves II-XII intact.   Skin:   normal overall no scars, lesions, ulcers or rashes. No psoriasis.  Psychiatric: Alert and oriented x 3.  Recent memory intact, remote memory unclear.  Normal mood and affect. Well groomed.  Good eye contact.  Cardiovascular: overall no swelling, no varicosities, no edema bilaterally, normal temperatures of the legs and arms, no clubbing, cyanosis and good capillary refill.  Lymphatic: palpation is normal.  She has no right foot pain or ankle pain today and has a normal gait.  She is wearing normal shoes.  The patient has been educated about the nature of the problem(s) and counseled on treatment options.  The  patient appeared to understand what I have discussed and is in agreement with it.  PLAN Call if any problems.  Precautions discussed.  Continue current medications.   Return to clinic 3 weeks

## 2015-05-13 ENCOUNTER — Other Ambulatory Visit: Payer: Self-pay | Admitting: Family Medicine

## 2015-05-18 ENCOUNTER — Ambulatory Visit: Payer: BLUE CROSS/BLUE SHIELD | Admitting: Orthopaedic Surgery

## 2015-06-03 ENCOUNTER — Ambulatory Visit: Payer: BLUE CROSS/BLUE SHIELD | Admitting: Nurse Practitioner

## 2015-06-04 ENCOUNTER — Ambulatory Visit: Payer: BLUE CROSS/BLUE SHIELD | Admitting: Nurse Practitioner

## 2015-06-11 ENCOUNTER — Telehealth: Payer: Self-pay | Admitting: Family Medicine

## 2015-06-11 ENCOUNTER — Encounter: Payer: Self-pay | Admitting: Family Medicine

## 2015-06-11 ENCOUNTER — Encounter: Payer: Self-pay | Admitting: Nurse Practitioner

## 2015-06-11 ENCOUNTER — Ambulatory Visit (INDEPENDENT_AMBULATORY_CARE_PROVIDER_SITE_OTHER): Payer: BLUE CROSS/BLUE SHIELD | Admitting: Nurse Practitioner

## 2015-06-11 VITALS — BP 122/74 | Ht 63.0 in | Wt 243.0 lb

## 2015-06-11 DIAGNOSIS — Z79891 Long term (current) use of opiate analgesic: Secondary | ICD-10-CM | POA: Diagnosis not present

## 2015-06-11 DIAGNOSIS — S29011A Strain of muscle and tendon of front wall of thorax, initial encounter: Secondary | ICD-10-CM

## 2015-06-11 DIAGNOSIS — S46912A Strain of unspecified muscle, fascia and tendon at shoulder and upper arm level, left arm, initial encounter: Secondary | ICD-10-CM | POA: Diagnosis not present

## 2015-06-11 MED ORDER — CYCLOBENZAPRINE HCL 10 MG PO TABS: 10.0000 mg | ORAL_TABLET | Freq: Three times a day (TID) | ORAL | Status: DC | PRN

## 2015-06-11 MED ORDER — HYDROCODONE-ACETAMINOPHEN 5-325 MG PO TABS: 1.0000 | ORAL_TABLET | Freq: Three times a day (TID) | ORAL | Status: DC | PRN

## 2015-06-11 MED ORDER — MELOXICAM 15 MG PO TABS: 15.0000 mg | ORAL_TABLET | Freq: Every day | ORAL | Status: DC

## 2015-06-11 NOTE — Patient Instructions (Signed)
Ice or heat applications OTC TENS unit [facility](Icy Hot Smart Relief) Stretching Lidocaine patch or gel Massage therapy

## 2015-06-11 NOTE — Telephone Encounter (Signed)
ERROR

## 2015-06-12 ENCOUNTER — Encounter: Payer: Self-pay | Admitting: Nurse Practitioner

## 2015-06-12 NOTE — Progress Notes (Signed)
Subjective:  Presents for c/o left neck and shoulder pain for the past 2-3 days. No specific history of injury but has a very active job in housekeeping; recently shampooed carpets. Feels pulling sensation at shoulder. Is also due for refill on pain med. Has taken for this with limited relief.  This patient was seen today for chronic pain  The medication list was reviewed and updated.   -Compliance with medication: yes  - Number patient states they take daily: up to 2 times per day prn  -when was the last dose patient took? Within past 24 hours  The patient was advised the importance of maintaining medication and not using illegal substances with these.  Refills needed: yes  The patient was educated that we can provide 3 monthly scripts for their medication, it is their responsibility to follow the instructions.  Side effects or complications from medications: none   Patient is aware that pain medications are meant to minimize the severity of the pain to allow their pain levels to improve to allow for better function. They are aware of that pain medications cannot totally remove their pain.  Due for UDT ( at least once per year) : 09/01/14 [place] CSR reviewed.   Objective:   BP 122/74 mmHg  Ht 5\' 3"  (1.6 m)  Wt 243 lb (110.224 kg)  BMI 43.06 kg/m2 NAD. Alert, oriented. Lungs clear. Heart RRR. Extremely tight tender muscles noted noted along upper left back, left neck and shoulder and left upper anterior chest wall with palpation. Normal ROM of neck and shoulder with tenderness. Hand strength 5+ bilat.   Assessment:  Problem List Items Addressed This Visit      Other   Encounter for long-term opiate analgesic use - Primary    Other Visit Diagnoses    Muscle strain of chest wall, initial encounter        Muscle strain of left shoulder, initial encounter          Plan:  Meds ordered this encounter  Medications  . DISCONTD: HYDROcodone-acetaminophen (NORCO/VICODIN) 5-325 MG tablet     Sig: Take 1 tablet by mouth 3 (three) times daily as needed for moderate pain.    Dispense:  60 tablet    Refill:  0    May fill 60 days from 06/11/15    Order Specific Question:  Supervising Provider    Answer:  Merlyn AlbertLUKING, WILLIAM S [[id number]]  . meloxicam (MOBIC) 15 MG tablet    Sig: Take 1 tablet (15 mg total) by mouth daily. Prn pain    Dispense:  30 tablet    Refill:  2    Order Specific Question:  Supervising Provider    Answer:  Merlyn AlbertLUKING, WILLIAM S [[id number]]  . cyclobenzaprine (FLEXERIL) 10 MG tablet    Sig: Take 1 tablet (10 mg total) by mouth 3 (three) times daily as needed for muscle spasms.    Dispense:  30 tablet    Refill:  0    Order Specific Question:  Supervising Provider    Answer:  Merlyn AlbertLUKING, WILLIAM S [[id number]]  . DISCONTD: HYDROcodone-acetaminophen (NORCO/VICODIN) 5-325 MG tablet    Sig: Take 1 tablet by mouth 3 (three) times daily as needed for moderate pain.    Dispense:  60 tablet    Refill:  0    May fill 30 days from 06/11/15    Order Specific Question:  Supervising Provider    Answer:  Merlyn AlbertLUKING, WILLIAM S [[id number]]  . HYDROcodone-acetaminophen (NORCO/VICODIN) 5-325 MG tablet  Sig: Take 1 tablet by mouth 3 (three) times daily as needed for moderate pain.    Dispense:  60 tablet    Refill:  0    Order Specific Question:  Supervising Provider    Answer:  Merlyn Albert [[id number]]   Ice or heat applications OTC TENS unit [facility] Smart Relief) Stretching Lidocaine patch or gel Massage therapy Call back in 7-10 days if no improvement, sooner if worse. Recommend physical.  Return in about 3 months (around 09/10/2015) for med recheck.

## 2015-06-14 ENCOUNTER — Encounter: Payer: Self-pay | Admitting: Nurse Practitioner

## 2015-06-21 ENCOUNTER — Encounter: Payer: Self-pay | Admitting: Family Medicine

## 2015-06-21 ENCOUNTER — Ambulatory Visit (INDEPENDENT_AMBULATORY_CARE_PROVIDER_SITE_OTHER): Payer: BLUE CROSS/BLUE SHIELD | Admitting: Family Medicine

## 2015-06-21 VITALS — BP 124/86 | Temp 98.3°F | Ht 63.0 in | Wt 251.0 lb

## 2015-06-21 DIAGNOSIS — L301 Dyshidrosis [pompholyx]: Secondary | ICD-10-CM

## 2015-06-21 MED ORDER — DOXYCYCLINE HYCLATE 100 MG PO CAPS: 100.0000 mg | ORAL_CAPSULE | Freq: Two times a day (BID) | ORAL | Status: DC

## 2015-06-21 MED ORDER — MOMETASONE FUROATE 0.1 % EX CREA: TOPICAL_CREAM | CUTANEOUS | Status: DC

## 2015-06-21 NOTE — Progress Notes (Signed)
   Subjective:    Patient ID: Megan Parker, female    DOB: 01/18/1966, 50 y.o.   MRN: [id number]  HPI Patient is here today for blisters on her hands. Onset 2 days ago. Treatments tried: pain pills, lotion with no relief. Itching and burning also noted.  Denies chest tightness pressure pain still smokes been counseled to quit smoking   Review of Systems Denies high fever chills sweats nausea vomiting diarrhea    Objective:   Physical Exam   Lungs clear heart regular has significant eczema on the inside portion of both feet as well as what appears to be dyshidrotic eczema on the hands with some pustules     Assessment & Plan:  Dyshydrotic eczema in the hands as well as significant eczema in the feet use [place] cream on a regular basiswe will also go ahead with [place] can twice a day for 7 days if ongoing troubles patient to notify us and we will refer her for dermatology consultation

## 2015-09-08 ENCOUNTER — Ambulatory Visit: Payer: BLUE CROSS/BLUE SHIELD | Admitting: Nurse Practitioner

## 2015-09-17 ENCOUNTER — Ambulatory Visit (INDEPENDENT_AMBULATORY_CARE_PROVIDER_SITE_OTHER): Payer: BLUE CROSS/BLUE SHIELD | Admitting: Family Medicine

## 2015-09-17 ENCOUNTER — Encounter: Payer: Self-pay | Admitting: Family Medicine

## 2015-09-17 VITALS — BP 148/88 | Ht 63.0 in | Wt 251.0 lb

## 2015-09-17 DIAGNOSIS — M25562 Pain in left knee: Secondary | ICD-10-CM

## 2015-09-17 DIAGNOSIS — G542 Cervical root disorders, not elsewhere classified: Secondary | ICD-10-CM

## 2015-09-17 MED ORDER — MELOXICAM 15 MG PO TABS: 15.0000 mg | ORAL_TABLET | Freq: Every day | ORAL | 2 refills | Status: DC

## 2015-09-17 MED ORDER — FLUOXETINE HCL 20 MG PO CAPS: 40.0000 mg | ORAL_CAPSULE | Freq: Every day | ORAL | 5 refills | Status: DC

## 2015-09-17 MED ORDER — HYDROCODONE-ACETAMINOPHEN 5-325 MG PO TABS: 1.0000 | ORAL_TABLET | Freq: Three times a day (TID) | ORAL | 0 refills | Status: DC | PRN

## 2015-09-17 NOTE — Progress Notes (Signed)
   Subjective:    Patient ID: Megan Parker, female    DOB: 1965/10/03, 50 y.o.   MRN: [id number]  HPIleft knee pain and popping. Knee buckles when walking. Started 2 weeks ago. She describes the pain is bad on the medial aspect she relates her knee gives way. She relates pain and swelling in the knee. Denies any specific injury. Patient does struggle with moderate obesity. Left shoulder pain and neck pain. Started 2 weeks ago. Was taking hydrocodone but rx ran out.  Intermittent numbness and tingling into the left hand. Denies weakness. States intermittent pain down the left shoulder region left trapezius region. No injury. Needs refill on prozac. Her moods are doing well she just requests refill on her medication  Review of Systems     Objective:   Physical Exam  Subjective discomfort left side of neck radiating into shoulder subjective numbness tingling into the left hand significant tenderness noted on the medial aspect of the left knee with increased pain with flexion inability to flex all the way ligaments appear to be stable calf normal no sign of DVT      Assessment & Plan:  Left knee pain probable cartilage problem pain medication may be used as needed caution drowsiness must see orthopedics for further evaluation possible MRI possible injections physical therapy and possible surgery  Left cervical nerve impingement in addition to this possible carpal tunnel into the left hand or nerve impingement causing this may need further workup will be seeing orthopedics. Patient was encouraged to talk with him about it. If need be follow-up with [place] and we'll be happy to workup further  Anti-inflammatory when necessary pain medication when necessary warning signs discuss not for frequent use on the pain medicine  Patient understands that pain medications not for long-term use if needing regular dosing of pain medicine she will need to become established as a pain management patient

## 2015-09-20 ENCOUNTER — Encounter: Payer: Self-pay | Admitting: Family Medicine

## 2015-10-13 ENCOUNTER — Other Ambulatory Visit: Payer: Self-pay | Admitting: Nurse Practitioner

## 2015-11-22 ENCOUNTER — Other Ambulatory Visit: Payer: Self-pay | Admitting: *Deleted

## 2015-11-22 ENCOUNTER — Telehealth: Payer: Self-pay | Admitting: Family Medicine

## 2015-11-22 NOTE — Telephone Encounter (Signed)
I was reviewing information from Dr. Madelon Lipsaffrey who saw the patient for orthopedic knee issue. This patient med lists states that she is taking meloxicam and taking nap percent. She only needs to be on one or the other. We do not recommend being on both because increased risk of kidney damage. She needs to choose one anti-inflammatory or the other. Please discontinue which ever anti-inflammatory she no longer once to be on. Please documented accordingly.

## 2015-11-22 NOTE — Telephone Encounter (Signed)
Discussed with pt. Pt states she is only taking naproxen. Med list updated.

## 2015-12-23 ENCOUNTER — Ambulatory Visit (INDEPENDENT_AMBULATORY_CARE_PROVIDER_SITE_OTHER): Payer: BLUE CROSS/BLUE SHIELD | Admitting: Orthopaedic Surgery

## 2015-12-29 ENCOUNTER — Encounter: Payer: Self-pay | Admitting: Family Medicine

## 2015-12-29 ENCOUNTER — Ambulatory Visit (INDEPENDENT_AMBULATORY_CARE_PROVIDER_SITE_OTHER): Payer: BLUE CROSS/BLUE SHIELD | Admitting: Nurse Practitioner

## 2015-12-29 ENCOUNTER — Encounter: Payer: Self-pay | Admitting: Nurse Practitioner

## 2015-12-29 VITALS — BP 122/80 | Temp 98.5°F | Ht 63.0 in | Wt 257.0 lb

## 2015-12-29 DIAGNOSIS — J209 Acute bronchitis, unspecified: Secondary | ICD-10-CM | POA: Diagnosis not present

## 2015-12-29 DIAGNOSIS — J4521 Mild intermittent asthma with (acute) exacerbation: Secondary | ICD-10-CM

## 2015-12-29 MED ORDER — AZITHROMYCIN 250 MG PO TABS: ORAL_TABLET | ORAL | 0 refills | Status: DC

## 2015-12-29 MED ORDER — ALBUTEROL SULFATE (2.5 MG/3ML) 0.083% IN NEBU
2.5000 mg | INHALATION_SOLUTION | Freq: Once | RESPIRATORY_TRACT | Status: AC
  Administered 2015-12-29: 2.5 mg via RESPIRATORY_TRACT

## 2015-12-29 MED ORDER — PREDNISONE 20 MG PO TABS: ORAL_TABLET | ORAL | 0 refills | Status: DC

## 2015-12-30 ENCOUNTER — Encounter: Payer: Self-pay | Admitting: Nurse Practitioner

## 2015-12-30 NOTE — Progress Notes (Signed)
Subjective:  Presents for complaints of coughing, wheezing, ear pain and sore throat that began yesterday. No fever. Headache. Frequent cough worse at night. No chest pain. Has used her albuterol 1, none today. Bilateral ear pressure. Continues to smoke about 1 pack per day. No vomiting diarrhea or abdominal pain. Taking fluids well. Voiding normal limit.  Objective:   BP 122/80   Temp 98.5 F (36.9 C) (Oral)   Ht 5\' 3"  (1.6 m)   Wt 257 lb (116.6 kg)   BMI 45.53 kg/m  NAD. Alert, oriented. TMs clear effusion, no erythema. Pharynx mildly injected with PND noted. Neck supple with mild soft anterior adenopathy. Lungs initially moderately diminished breath sounds in general with occasional faint expiratory wheeze. No tachypnea. Normal color. Coughed noted with deep breath. Given albuterol 2.5 mg nebulizer treatment. Airflow significantly improved with scattered expiratory wheezes noted. Heart regular rate rhythm.  Assessment: Mild intermittent chronic asthma with acute exacerbation - Plan: albuterol (PROVENTIL) (2.5 MG/3ML) 0.083% nebulizer solution 2.5 mg  Acute bronchitis, unspecified organism  Plan:  Meds ordered this encounter  Medications  . albuterol (PROVENTIL) (2.5 MG/3ML) 0.083% nebulizer solution 2.5 mg  . azithromycin (ZITHROMAX Z-PAK) 250 MG tablet    Sig: Take 2 tablets (500 mg) on  Day 1,  followed by 1 tablet (250 mg) once daily on Days 2 through 5.    Dispense:  6 each    Refill:  0    Order Specific Question:   Supervising Provider    Answer:   Merlyn AlbertLUKING, WILLIAM S [[id number]]  . predniSONE (DELTASONE) 20 MG tablet    Sig: 3 po qd x 3 d then 2 po qd x 3 d then 1 po qd x 2 d    Dispense:  17 tablet    Refill:  0    Order Specific Question:   Supervising Provider    Answer:   Merlyn AlbertLUKING, WILLIAM S [[id number]]   OTC meds as directed for cough and congestion. Warning signs reviewed. Continue albuterol as directed. Call back in 48 hours if no improvement, sooner if worse.

## 2016-06-07 ENCOUNTER — Other Ambulatory Visit: Payer: Self-pay | Admitting: Family Medicine

## 2016-06-07 ENCOUNTER — Other Ambulatory Visit: Payer: Self-pay | Admitting: Nurse Practitioner

## 2016-06-08 ENCOUNTER — Ambulatory Visit (INDEPENDENT_AMBULATORY_CARE_PROVIDER_SITE_OTHER): Payer: BLUE CROSS/BLUE SHIELD | Admitting: Family Medicine

## 2016-06-08 ENCOUNTER — Encounter: Payer: Self-pay | Admitting: Family Medicine

## 2016-06-08 VITALS — Temp 98.3°F | Ht 63.0 in | Wt 268.2 lb

## 2016-06-08 DIAGNOSIS — R5383 Other fatigue: Secondary | ICD-10-CM | POA: Diagnosis not present

## 2016-06-08 DIAGNOSIS — J019 Acute sinusitis, unspecified: Secondary | ICD-10-CM | POA: Diagnosis not present

## 2016-06-08 DIAGNOSIS — J4521 Mild intermittent asthma with (acute) exacerbation: Secondary | ICD-10-CM

## 2016-06-08 MED ORDER — PREDNISONE 20 MG PO TABS: ORAL_TABLET | ORAL | 0 refills | Status: DC

## 2016-06-08 MED ORDER — ALBUTEROL SULFATE HFA 108 (90 BASE) MCG/ACT IN AERS: 2.0000 | INHALATION_SPRAY | Freq: Four times a day (QID) | RESPIRATORY_TRACT | 2 refills | Status: DC | PRN

## 2016-06-08 MED ORDER — AMOXICILLIN-POT CLAVULANATE 875-125 MG PO TABS: 1.0000 | ORAL_TABLET | Freq: Two times a day (BID) | ORAL | 0 refills | Status: DC

## 2016-06-08 NOTE — Progress Notes (Signed)
   Subjective:    Patient ID: Megan Parker, female    DOB: 05-07-1965, 51 y.o.   MRN: [id number]  Cough  This is a new problem. The current episode started in the past 7 days. Associated symptoms include ear pain, headaches, nasal congestion, rhinorrhea and wheezing. Pertinent negatives include no chest pain, fever or shortness of breath. Treatments tried: ibuprofen.   Viral like illness for several days head congestion drainage coughing some wheezing no vomiting patient smokes she know she needs to quit   Review of Systems  Constitutional: Negative for activity change and fever.  HENT: Positive for congestion, ear pain and rhinorrhea.   Eyes: Negative for discharge.  Respiratory: Positive for cough and wheezing. Negative for shortness of breath.   Cardiovascular: Negative for chest pain.  Neurological: Positive for headaches.       Objective:   Physical Exam  Constitutional: She appears well-developed.  HENT:  Head: Normocephalic.  Nose: Nose normal.  Mouth/Throat: Oropharynx is clear and moist. No oropharyngeal exudate.  Neck: Neck supple.  Cardiovascular: Normal rate and normal heart sounds.   No murmur heard. Pulmonary/Chest: Effort normal and breath sounds normal. She has no wheezes.  Lymphadenopathy:    She has no cervical adenopathy.  Skin: Skin is warm and dry.  Nursing note and vitals reviewed.         Assessment & Plan:  Viral illness Secondary rhinosinusitis Reactive airway Fatigue and tiredness long standing Hypersomnolence possible sleep apnea Patient to do [place] questionnaire then returned Prednisone taper Antibiotic prescribed Lab work ordered. Fatigue workup Follow-up if from

## 2016-06-10 LAB — CBC WITH DIFFERENTIAL/PLATELET
BASOS ABS: 0 10*3/uL (ref 0.0–0.2)
Basos: 0 %
EOS (ABSOLUTE): 0.2 10*3/uL (ref 0.0–0.4)
Eos: 3 %
Hematocrit: 37.4 % (ref 34.0–46.6)
Hemoglobin: 12.4 g/dL (ref 11.1–15.9)
Immature Grans (Abs): 0 10*3/uL (ref 0.0–0.1)
Immature Granulocytes: 1 %
Lymphocytes Absolute: 2 10*3/uL (ref 0.7–3.1)
Lymphs: 25 %
MCH: 27.3 pg (ref 26.6–33.0)
MCHC: 33.2 g/dL (ref 31.5–35.7)
MCV: 82 fL (ref 79–97)
MONOS ABS: 0.6 10*3/uL (ref 0.1–0.9)
Monocytes: 7 %
NEUTROS ABS: 5.2 10*3/uL (ref 1.4–7.0)
Neutrophils: 64 %
PLATELETS: 272 10*3/uL (ref 150–379)
RBC: 4.54 x10E6/uL (ref 3.77–5.28)
RDW: 14.8 % (ref 12.3–15.4)
WBC: 8.1 10*3/uL (ref 3.4–10.8)

## 2016-06-10 LAB — BASIC METABOLIC PANEL
BUN/Creatinine Ratio: 21 (ref 9–23)
BUN: 15 mg/dL (ref 6–24)
CHLORIDE: 103 mmol/L (ref 96–106)
CO2: 27 mmol/L (ref 18–29)
CREATININE: 0.7 mg/dL (ref 0.57–1.00)
Calcium: 9.6 mg/dL (ref 8.7–10.2)
GFR calc Af Amer: 117 mL/min/{1.73_m2} (ref >59)
GFR, EST NON AFRICAN AMERICAN: 101 mL/min/{1.73_m2} (ref >59)
Glucose: 96 mg/dL (ref 65–99)
Potassium: 4.5 mmol/L (ref 3.5–5.2)
Sodium: 144 mmol/L (ref 134–144)

## 2016-06-10 LAB — HEPATIC FUNCTION PANEL
ALK PHOS: 121 IU/L — AB (ref 39–117)
ALT: 99 IU/L — AB (ref 0–32)
AST: 46 IU/L — ABNORMAL HIGH (ref 0–40)
Albumin: 4.2 g/dL (ref 3.5–5.5)
BILIRUBIN TOTAL: 0.3 mg/dL (ref 0.0–1.2)
BILIRUBIN, DIRECT: 0.1 mg/dL (ref 0.00–0.40)
Total Protein: 6.6 g/dL (ref 6.0–8.5)

## 2016-06-10 LAB — TSH: TSH: 1.31 u[IU]/mL (ref 0.450–4.500)

## 2016-06-10 LAB — T4, FREE: Free T4: 1.05 ng/dL (ref 0.82–1.77)

## 2016-06-12 ENCOUNTER — Other Ambulatory Visit: Payer: Self-pay | Admitting: *Deleted

## 2016-06-12 DIAGNOSIS — R748 Abnormal levels of other serum enzymes: Secondary | ICD-10-CM

## 2016-06-27 ENCOUNTER — Encounter: Payer: Self-pay | Admitting: Family Medicine

## 2016-06-27 ENCOUNTER — Ambulatory Visit (INDEPENDENT_AMBULATORY_CARE_PROVIDER_SITE_OTHER): Payer: BLUE CROSS/BLUE SHIELD | Admitting: Family Medicine

## 2016-06-27 VITALS — BP 140/84 | Ht 63.0 in | Wt 260.4 lb

## 2016-06-27 DIAGNOSIS — M5432 Sciatica, left side: Secondary | ICD-10-CM | POA: Diagnosis not present

## 2016-06-27 MED ORDER — DICLOFENAC SODIUM 75 MG PO TBEC: 75.0000 mg | DELAYED_RELEASE_TABLET | Freq: Two times a day (BID) | ORAL | 1 refills | Status: DC

## 2016-06-27 MED ORDER — HYDROCODONE-ACETAMINOPHEN 10-325 MG PO TABS: 1.0000 | ORAL_TABLET | ORAL | 0 refills | Status: DC | PRN

## 2016-06-27 MED ORDER — CHLORZOXAZONE 500 MG PO TABS: 500.0000 mg | ORAL_TABLET | Freq: Three times a day (TID) | ORAL | 1 refills | Status: DC | PRN

## 2016-06-27 NOTE — Progress Notes (Signed)
   Subjective:    Patient ID: Megan Parker, female    DOB: 08/12/1965, 51 y.o.   MRN: [id number]  Back Pain  This is a recurrent problem. The current episode started in the past 7 days. Quality: Sharp  The pain radiates to the left thigh. Associated symptoms include leg pain. She has tried heat for the symptoms.   Patient states no other concerns this visit.  Started yesterday. Severe pain was having a very difficult time walking yesterday doing a little better today had this before several years ago  Review of Systems  Musculoskeletal: Positive for back pain.  Relates pain radiates down the left leg. No weakness. Denies loss of bowel or bladder control.     Objective:   Physical Exam Subjective discomfort in the lower left back radiates down the left leg positive straight leg raise has limited range of motion with [place] and extension strength is good. Lungs clear heart regular       Assessment & Plan:  Back exercises shown Patient recently on steroids therefore no new steroids Pain medication caution drowsiness mainly for home use avoid frequent use not for long-term Should gradually get better over the next 6-8 weeks if not doing better within the next 6-8 weeks follow-up May need MRI Warning signs discussed

## 2016-06-27 NOTE — Patient Instructions (Signed)

## 2016-07-10 ENCOUNTER — Telehealth: Payer: Self-pay | Admitting: Family Medicine

## 2016-07-10 ENCOUNTER — Other Ambulatory Visit: Payer: Self-pay | Admitting: Family Medicine

## 2016-07-10 NOTE — Telephone Encounter (Signed)
Pt is requesting a refill on HYDROcodone-acetaminophen (NORCO) 10-325 MG tablet  

## 2016-07-11 ENCOUNTER — Other Ambulatory Visit: Payer: Self-pay | Admitting: *Deleted

## 2016-07-11 MED ORDER — HYDROCODONE-ACETAMINOPHEN 10-325 MG PO TABS: 1.0000 | ORAL_TABLET | ORAL | 0 refills | Status: DC | PRN

## 2016-07-11 NOTE — Telephone Encounter (Signed)
Discussed with pt and script ready for pickup.

## 2016-07-11 NOTE — Telephone Encounter (Signed)
Patient may have 30 tablets. If patient has not seen improvement of her discomfort by the second week of June she should follow-up for a 6 week recheck

## 2016-08-25 ENCOUNTER — Other Ambulatory Visit: Payer: Self-pay | Admitting: Family Medicine

## 2016-08-25 NOTE — Telephone Encounter (Signed)
This +4 refills 

## 2016-10-06 ENCOUNTER — Other Ambulatory Visit: Payer: Self-pay | Admitting: Family Medicine

## 2016-11-28 ENCOUNTER — Other Ambulatory Visit: Payer: Self-pay | Admitting: Nurse Practitioner

## 2016-11-29 NOTE — Telephone Encounter (Signed)
Last seen 05/28/16 

## 2016-11-29 NOTE — Telephone Encounter (Signed)
This +1 refill needs follow-up office visit 

## 2017-01-25 ENCOUNTER — Other Ambulatory Visit: Payer: Self-pay | Admitting: Nurse Practitioner

## 2017-02-08 ENCOUNTER — Encounter: Payer: Self-pay | Admitting: Family Medicine

## 2017-02-08 ENCOUNTER — Encounter: Payer: Self-pay | Admitting: Nurse Practitioner

## 2017-02-08 ENCOUNTER — Ambulatory Visit: Payer: BLUE CROSS/BLUE SHIELD | Admitting: Nurse Practitioner

## 2017-02-08 VITALS — BP 124/84 | HR 80 | Temp 98.3°F | Ht 63.0 in | Wt 262.6 lb

## 2017-02-08 DIAGNOSIS — J4521 Mild intermittent asthma with (acute) exacerbation: Secondary | ICD-10-CM | POA: Diagnosis not present

## 2017-02-08 DIAGNOSIS — J069 Acute upper respiratory infection, unspecified: Secondary | ICD-10-CM | POA: Diagnosis not present

## 2017-02-08 DIAGNOSIS — B9689 Other specified bacterial agents as the cause of diseases classified elsewhere: Secondary | ICD-10-CM | POA: Diagnosis not present

## 2017-02-08 MED ORDER — PREDNISONE 20 MG PO TABS
ORAL_TABLET | ORAL | 0 refills | Status: DC
Start: 2017-02-08 — End: 2017-03-20

## 2017-02-08 MED ORDER — HYDROCODONE-HOMATROPINE 5-1.5 MG/5ML PO SYRP: 5.0000 mL | ORAL_SOLUTION | ORAL | 0 refills | Status: DC | PRN

## 2017-02-08 MED ORDER — AMOXICILLIN-POT CLAVULANATE 875-125 MG PO TABS: 1.0000 | ORAL_TABLET | Freq: Two times a day (BID) | ORAL | 0 refills | Status: DC

## 2017-02-08 MED ORDER — ALBUTEROL SULFATE (2.5 MG/3ML) 0.083% IN NEBU: INHALATION_SOLUTION | RESPIRATORY_TRACT | 0 refills | Status: DC

## 2017-02-08 MED ORDER — ALBUTEROL SULFATE (2.5 MG/3ML) 0.083% IN NEBU
2.5000 mg | INHALATION_SOLUTION | Freq: Once | RESPIRATORY_TRACT | Status: AC
  Administered 2017-02-08: 2.5 mg via RESPIRATORY_TRACT

## 2017-02-10 ENCOUNTER — Encounter: Payer: Self-pay | Admitting: Nurse Practitioner

## 2017-02-10 NOTE — Progress Notes (Signed)
Subjective: Presents for complaints of cough head congestion that began 2 days ago.  No fever.  Sore throat.  Facial area headache.  Runny nose.  Frequent cough producing green mucus.  Wheezing. Just picked up her new pro-air inhaler.  Has not used it yet.  Difficulty sleeping due to cough and wheezing.  Chest tightness with deep breath or cough.  Bilateral ear pain.  No vomiting diarrhea or abdominal pain.  Objective:   BP 124/84 (BP Location: Right Arm, Patient Position: Sitting)   Pulse 80   Temp 98.3 F (36.8 C)   Ht 5\' 3"  (1.6 m)   Wt 262 lb 9.6 oz (119.1 kg)   SpO2 98%   BMI 46.52 kg/m  NAD.  Alert, oriented.  TMs clear effusion, no erythema.  Pharynx moderate erythema with PND noted.  Neck supple with mild soft anterior adenopathy.  Lungs initially mildly diminished breath sounds in general with faint expiratory wheezes.  No tachypnea.  Frequent congested cough.  Given albuterol 2.5 mg nebulizer treatment.  Airflow improved with continued expiratory wheezes.  Subjective improvement of symptoms.  Heart regular rate and rhythm.  Assessment:   Problem List Items Addressed This Visit      Respiratory   Asthma, chronic - Primary   Relevant Medications   predniSONE (DELTASONE) 20 MG tablet   albuterol (PROVENTIL) (2.5 MG/3ML) 0.083% nebulizer solution   albuterol (PROVENTIL) (2.5 MG/3ML) 0.083% nebulizer solution 2.5 mg (Completed)    Other Visit Diagnoses    Bacterial upper respiratory infection           Plan:   Meds ordered this encounter  Medications  . predniSONE (DELTASONE) 20 MG tablet    Sig: 3 po qd x 3 d then 2 po qd x 3 d then 1 po qd x 2 d    Dispense:  17 tablet    Refill:  0    Order Specific Question:   Supervising Provider    Answer:   Merlyn AlbertLUKING, WILLIAM S [[id number]]  . albuterol (PROVENTIL) (2.5 MG/3ML) 0.083% nebulizer solution    Sig: Use via neb q 4 hrs prn wheezing    Dispense:  25 vial    Refill:  0    Order Specific Question:   Supervising Provider   Answer:   Merlyn AlbertLUKING, WILLIAM S [[id number]]  . HYDROcodone-homatropine (HYCODAN) 5-1.5 MG/5ML syrup    Sig: Take 5 mLs by mouth every 4 (four) hours as needed.    Dispense:  90 mL    Refill:  0    Has taken hydrocodone without difficulty    Order Specific Question:   Supervising Provider    Answer:   Merlyn AlbertLUKING, WILLIAM S [[id number]]  . amoxicillin-clavulanate (AUGMENTIN) 875-125 MG tablet    Sig: Take 1 tablet by mouth 2 (two) times daily.    Dispense:  20 tablet    Refill:  0    Order Specific Question:   Supervising Provider    Answer:   Merlyn AlbertLUKING, WILLIAM S [[id number]]  . albuterol (PROVENTIL) (2.5 MG/3ML) 0.083% nebulizer solution 2.5 mg   Given prescription for nebulizer machine and supplies.  May alternate with her albuterol inhaler.  OTC meds as directed for congestion and cough.  Hycodan syrup for nighttime use, drowsiness precautions.  Call back in 48 hours if no improvement, go to ED sooner if worse.  Call back early next week if no significant improvement.

## 2017-03-09 ENCOUNTER — Other Ambulatory Visit: Payer: Self-pay | Admitting: Family Medicine

## 2017-03-11 NOTE — Telephone Encounter (Signed)
1 month supply, send her a card she needs office visit or no further refills

## 2017-03-20 ENCOUNTER — Encounter: Payer: Self-pay | Admitting: Family Medicine

## 2017-03-20 ENCOUNTER — Ambulatory Visit (INDEPENDENT_AMBULATORY_CARE_PROVIDER_SITE_OTHER): Payer: BLUE CROSS/BLUE SHIELD | Admitting: Family Medicine

## 2017-03-20 VITALS — BP 128/86 | Temp 98.6°F | Ht 63.0 in | Wt 260.8 lb

## 2017-03-20 DIAGNOSIS — J4541 Moderate persistent asthma with (acute) exacerbation: Secondary | ICD-10-CM

## 2017-03-20 DIAGNOSIS — J45909 Unspecified asthma, uncomplicated: Secondary | ICD-10-CM | POA: Insufficient documentation

## 2017-03-20 DIAGNOSIS — B9689 Other specified bacterial agents as the cause of diseases classified elsewhere: Secondary | ICD-10-CM | POA: Diagnosis not present

## 2017-03-20 DIAGNOSIS — J019 Acute sinusitis, unspecified: Secondary | ICD-10-CM | POA: Diagnosis not present

## 2017-03-20 MED ORDER — PREDNISONE 20 MG PO TABS: ORAL_TABLET | ORAL | 0 refills | Status: DC

## 2017-03-20 MED ORDER — ALBUTEROL SULFATE HFA 108 (90 BASE) MCG/ACT IN AERS: 2.0000 | INHALATION_SPRAY | RESPIRATORY_TRACT | 6 refills | Status: DC | PRN

## 2017-03-20 MED ORDER — ALBUTEROL SULFATE (2.5 MG/3ML) 0.083% IN NEBU: INHALATION_SOLUTION | RESPIRATORY_TRACT | 12 refills | Status: DC

## 2017-03-20 MED ORDER — FLUTICASONE PROPIONATE HFA 220 MCG/ACT IN AERO: 2.0000 | INHALATION_SPRAY | Freq: Two times a day (BID) | RESPIRATORY_TRACT | 12 refills | Status: DC

## 2017-03-20 MED ORDER — VARENICLINE TARTRATE 1 MG PO TABS: 1.0000 mg | ORAL_TABLET | Freq: Two times a day (BID) | ORAL | 1 refills | Status: DC

## 2017-03-20 MED ORDER — VARENICLINE TARTRATE 0.5 MG X 11 & 1 MG X 42 PO MISC: ORAL | 0 refills | Status: DC

## 2017-03-20 MED ORDER — AMOXICILLIN-POT CLAVULANATE 875-125 MG PO TABS: 1.0000 | ORAL_TABLET | Freq: Two times a day (BID) | ORAL | 0 refills | Status: DC

## 2017-03-20 NOTE — Progress Notes (Signed)
   Subjective:    Patient ID: Megan Parker, female    DOB: 04/07/1965, 52 y.o.   MRN: [id number]  Cough  This is a new problem. The current episode started in the past 7 days. Associated symptoms include ear pain, headaches, nasal congestion, a sore throat and wheezing. Treatments tried: albuterol, tylenol sinus.   O2 97% on room air This patient's had significant coughing or the past several days.  Denies high fever chills or body aches Does have reactive airway bilateral Some nausea Does smoke but she is trying to quit She is interested in trying Chantix   Review of Systems  HENT: Positive for ear pain and sore throat.   Respiratory: Positive for cough and wheezing.   Neurological: Positive for headaches.       Objective:   Physical Exam  Constitutional: She appears well-developed.  HENT:  Head: Normocephalic.  Right Ear: External ear normal.  Left Ear: External ear normal.  Nose: Nose normal.  Mouth/Throat: Oropharynx is clear and moist. No oropharyngeal exudate.  Eyes: Right eye exhibits no discharge. Left eye exhibits no discharge.  Neck: Neck supple. No tracheal deviation present.  Cardiovascular: Normal rate and normal heart sounds.  No murmur heard. Pulmonary/Chest: Effort normal. She has wheezes. She has no rales.  Lymphadenopathy:    She has no cervical adenopathy.  Skin: Skin is warm and dry.  Nursing note and vitals reviewed.  The patient is not respiratory distress but she would benefit from having a nebulizer treatment this was given to her significant improvement with this I find no evidence of pneumonia currently if she gets worse she will need to go to the ER for further evaluation including x-rays and lab work       Assessment & Plan:  More than likely a viral illness There is secondary sinus infection as well as bronchial involvement and reactive airway Patient needs to quit smoking Chantix discussed in detail including potential side  effects Patient has had weird dreams before with Chantix but she would like to try it again She states it was not bad enough to not take the medicine She was cautioned if she starts feeling suicidal or severely depressed she needs to stop medicine right away Patient understands if she gets worse to go to ER Start preventative inhaler on a regular basis Albuterol as a rescue inhaler need  If patient does not see significant improvement over the course of the next few weeks or moderate improvement over the next few days she needs to let us know

## 2017-04-15 ENCOUNTER — Other Ambulatory Visit: Payer: Self-pay | Admitting: Family Medicine

## 2017-04-16 NOTE — Telephone Encounter (Signed)
She may have 1 refill with follow-up office visit with him the next month

## 2017-05-04 ENCOUNTER — Other Ambulatory Visit: Payer: Self-pay | Admitting: *Deleted

## 2017-05-04 ENCOUNTER — Telehealth: Payer: Self-pay | Admitting: Family Medicine

## 2017-05-04 MED ORDER — PREDNISONE 20 MG PO TABS: ORAL_TABLET | ORAL | 0 refills | Status: DC

## 2017-05-04 MED ORDER — CEFDINIR 300 MG PO CAPS: 300.0000 mg | ORAL_CAPSULE | Freq: Two times a day (BID) | ORAL | 0 refills | Status: DC

## 2017-05-04 NOTE — Telephone Encounter (Signed)
Pt called and states that she has the same thing she had a month ago. She is taking the breathing tx and inhaler. States her mucus is really green. States she has taken a whole bottle of cold and flu. Would like to know if she needs office visit or if provider would call in another ABT. Please advise. Thanks

## 2017-05-04 NOTE — Telephone Encounter (Signed)
Omnicef 300 mg 1 twice daily for 10, days prednisone taper 20 mg tablet, 3 tablets daily for the next 2 days, then 2 tablets daily for the next 2 days, then 1 tablet daily for the next 2 days

## 2017-05-04 NOTE — Telephone Encounter (Signed)
Discussed with pt. Pt verbalized understanding. meds sent to pharm. [facility] [address]

## 2017-06-13 ENCOUNTER — Ambulatory Visit: Payer: BLUE CROSS/BLUE SHIELD | Admitting: Nurse Practitioner

## 2017-06-13 ENCOUNTER — Encounter: Payer: Self-pay | Admitting: Nurse Practitioner

## 2017-06-13 ENCOUNTER — Ambulatory Visit (HOSPITAL_COMMUNITY)
Admission: RE | Admit: 2017-06-13 | Discharge: 2017-06-13 | Disposition: A | Discharge status: Home / Self Care | Payer: BLUE CROSS/BLUE SHIELD | Source: Ambulatory Visit | Attending: Nurse Practitioner | Admitting: Nurse Practitioner

## 2017-06-13 ENCOUNTER — Encounter: Payer: Self-pay | Admitting: Family Medicine

## 2017-06-13 VITALS — BP 118/90 | Temp 98.0°F | Ht 63.0 in | Wt 274.6 lb

## 2017-06-13 DIAGNOSIS — J4531 Mild persistent asthma with (acute) exacerbation: Secondary | ICD-10-CM

## 2017-06-13 DIAGNOSIS — R05 Cough: Secondary | ICD-10-CM

## 2017-06-13 DIAGNOSIS — F17200 Nicotine dependence, unspecified, uncomplicated: Secondary | ICD-10-CM | POA: Diagnosis not present

## 2017-06-13 DIAGNOSIS — R059 Cough, unspecified: Secondary | ICD-10-CM

## 2017-06-13 MED ORDER — PREDNISONE 20 MG PO TABS: ORAL_TABLET | ORAL | 0 refills | Status: DC

## 2017-06-13 MED ORDER — FLUTICASONE-SALMETEROL 250-50 MCG/DOSE IN AEPB: 1.0000 | INHALATION_SPRAY | Freq: Two times a day (BID) | RESPIRATORY_TRACT | 5 refills | Status: DC

## 2017-06-14 ENCOUNTER — Encounter: Payer: Self-pay | Admitting: Nurse Practitioner

## 2017-06-14 NOTE — Progress Notes (Signed)
Subjective: Presents for complaints of persistent cough over the past 2-3 months.  Slightly better while on prednisone but within a few days her symptoms came back.  No fever.  Mild sore throat.  No difficulty swallowing.  Headache.  Runny nose.  Frequent cough mostly clear mucus.  Minimal reflux only after eating certain foods, occurring mainly 1-2 times per month.  Restarted her Asmanex about 2 months ago, this prescription was done in 2016 and most likely outdated.  Has checked her O2 sat at work, usually running at least above 90.  No vomiting diarrhea or abdominal pain.  Has pro-air as a rescue inhaler using it at least a couple of times per day which helps some.  Continues to smoke, amount varies depending on whether she is working.  The maximum is 1 pack/day.  Objective:   BP 118/90   Pulse 96   Temp 98 F (36.7 C) (Oral)   Ht 5\' 3"  (1.6 m)   Wt 274 lb 9.6 oz (124.6 kg)   BMI 48.64 kg/m  NAD.  Alert, oriented.  TMs minimal clear effusion, no erythema.  Pharynx non-erythematous with clear PND noted.  Neck supple with mild soft anterior adenopathy.  Lungs initially mildly diminished breath sounds throughout lung fields with faint expiratory wheezes.  Given albuterol 2.5 mg nebulizer treatment.  Wheezing almost resolved with significant improvement of airflow.  No tachypnea.  Normal color.  O2 sat 96%.  Also subjective improvement of symptoms.  Heart regular rate and rhythm.  Assessment:   Problem List Items Addressed This Visit      Respiratory   Asthma, chronic - Primary   Relevant Medications   predniSONE (DELTASONE) 20 MG tablet   Fluticasone-Salmeterol (WIXELA INHUB) 250-50 MCG/DOSE AEPB    Other Visit Diagnoses    Cough       Relevant Orders   DG Chest 2 View (Completed)   Current every day smoker             Plan:   Meds ordered this encounter  Medications  . predniSONE (DELTASONE) 20 MG tablet    Sig: 3 po qd x 3 d then 2 po qd x 3 d then 1 po qd x 2 d    Dispense:   17 tablet    Refill:  0    Order Specific Question:   Supervising Provider    Answer:   Merlyn AlbertLUKING, WILLIAM S [[id number]]  . Fluticasone-Salmeterol (WIXELA INHUB) 250-50 MCG/DOSE AEPB    Sig: Inhale 1 puff into the lungs 2 (two) times daily.    Dispense:  60 each    Refill:  5    Order Specific Question:   Supervising Provider    Answer:   Merlyn AlbertLUKING, WILLIAM S [[id number]]   While in the office, patient contacted her employer about preferred medications with combined steroid and long-acting albuterol.  Prescription sent in for generic Advair.  Cautioned about potential adverse effects.  DC med and call if any problems.  Lengthy discussion regarding smoking cessation.  Patient could not take Chantix or bupropion due to side effects.  Given information on the state program for smoking cessation that supplies coaching and free products.  Chest x-ray pending.  Call back in 7 to 10 days if no improvement, sooner if worse.  Otherwise routine follow-up. 25 minutes was spent with the patient.  This statement verifies that 25 minutes was indeed spent with the patient. Greater than half the time was spent in discussion, counseling and answering questions  regarding the issues that the patient came in for today as reflected in the diagnosis (s) please refer to documentation for further details.

## 2017-06-19 ENCOUNTER — Telehealth: Payer: Self-pay | Admitting: *Deleted

## 2017-06-19 NOTE — Telephone Encounter (Signed)
Fax from [employer]. The request for coverage of wixela inhub is denied. This medication is approved when brand advair diskus has been tried and did not work, or was not tolerated.

## 2017-06-20 ENCOUNTER — Other Ambulatory Visit: Payer: Self-pay | Admitting: Nurse Practitioner

## 2017-06-20 MED ORDER — FLUTICASONE-SALMETEROL 250-50 MCG/DOSE IN AEPB: 1.0000 | INHALATION_SPRAY | Freq: Two times a day (BID) | RESPIRATORY_TRACT | 5 refills | Status: DC

## 2017-06-20 NOTE — Telephone Encounter (Signed)
Done

## 2017-06-20 NOTE — Telephone Encounter (Signed)
brand advair diskus

## 2017-06-20 NOTE — Telephone Encounter (Signed)
Please let patient know that her HR person at work was incorrect. If she or the pharmacy can tell us which is the preferred medication in that class, I will be glad to order. Thanks.

## 2017-06-26 ENCOUNTER — Telehealth: Payer: Self-pay

## 2017-06-26 NOTE — Telephone Encounter (Signed)
Patient is aware Wixela inhaler not covered by her insurance and has already picked up the alternative at Pharmacy.

## 2017-07-12 ENCOUNTER — Other Ambulatory Visit: Payer: Self-pay | Admitting: Family Medicine

## 2017-07-12 ENCOUNTER — Other Ambulatory Visit: Payer: Self-pay | Admitting: Nurse Practitioner

## 2017-08-13 ENCOUNTER — Other Ambulatory Visit: Payer: Self-pay | Admitting: Nurse Practitioner

## 2017-09-07 ENCOUNTER — Other Ambulatory Visit: Payer: Self-pay | Admitting: Nurse Practitioner

## 2017-10-08 ENCOUNTER — Other Ambulatory Visit: Payer: Self-pay | Admitting: *Deleted

## 2017-10-08 NOTE — Telephone Encounter (Signed)
No refill needs to be seen

## 2017-10-18 ENCOUNTER — Other Ambulatory Visit: Payer: Self-pay

## 2017-10-18 MED ORDER — FLUOXETINE HCL 20 MG PO CAPS: 40.0000 mg | ORAL_CAPSULE | Freq: Every day | ORAL | 0 refills | Status: DC

## 2017-12-01 ENCOUNTER — Other Ambulatory Visit: Payer: Self-pay | Admitting: Family Medicine

## 2017-12-03 NOTE — Telephone Encounter (Signed)
[facility] 

## 2017-12-03 NOTE — Telephone Encounter (Signed)
May have this +2 refills will need follow-up office visit 

## 2017-12-12 ENCOUNTER — Ambulatory Visit: Payer: BLUE CROSS/BLUE SHIELD | Admitting: Family Medicine

## 2017-12-12 ENCOUNTER — Encounter: Payer: Self-pay | Admitting: Family Medicine

## 2017-12-12 VITALS — Temp 98.2°F | Ht 63.0 in | Wt 275.0 lb

## 2017-12-12 DIAGNOSIS — R748 Abnormal levels of other serum enzymes: Secondary | ICD-10-CM

## 2017-12-12 DIAGNOSIS — R1013 Epigastric pain: Secondary | ICD-10-CM

## 2017-12-12 DIAGNOSIS — J019 Acute sinusitis, unspecified: Secondary | ICD-10-CM

## 2017-12-12 DIAGNOSIS — G4719 Other hypersomnia: Secondary | ICD-10-CM

## 2017-12-12 DIAGNOSIS — B9689 Other specified bacterial agents as the cause of diseases classified elsewhere: Secondary | ICD-10-CM

## 2017-12-12 DIAGNOSIS — F439 Reaction to severe stress, unspecified: Secondary | ICD-10-CM

## 2017-12-12 DIAGNOSIS — L301 Dyshidrosis [pompholyx]: Secondary | ICD-10-CM | POA: Diagnosis not present

## 2017-12-12 MED ORDER — MOMETASONE FUROATE 0.1 % EX CREA: TOPICAL_CREAM | CUTANEOUS | 2 refills | Status: DC

## 2017-12-12 MED ORDER — PANTOPRAZOLE SODIUM 40 MG PO TBEC: 40.0000 mg | DELAYED_RELEASE_TABLET | Freq: Every day | ORAL | 3 refills | Status: DC

## 2017-12-12 MED ORDER — ALPRAZOLAM 0.5 MG PO TABS: 0.5000 mg | ORAL_TABLET | Freq: Two times a day (BID) | ORAL | 1 refills | Status: DC | PRN

## 2017-12-12 MED ORDER — AMOXICILLIN-POT CLAVULANATE 875-125 MG PO TABS: 1.0000 | ORAL_TABLET | Freq: Two times a day (BID) | ORAL | 0 refills | Status: DC

## 2017-12-12 NOTE — Progress Notes (Signed)
   Subjective:    Patient ID: Megan Parker, female    DOB: 07-12-1965, 52 y.o.   MRN: [id number]  Sinus Problem  This is a new problem. The current episode started 1 to 4 weeks ago. Associated symptoms include congestion, coughing, ear pain and headaches. Pertinent negatives include no shortness of breath. (Wheezing) Past treatments include nothing.   Dad died due to liver issue-she states that this was some possibly of blood related issues but she is not sure she knew he had cirrhosis and fatty liver she states she will find out more and will let us know what it was  Burning in abd and nausea-relates epigastric pain discomfort esophageal issues of burning reflux denies high fever chills sweats  Relates a lot of itching on her feet in her hands she gets this on a periodic basis throughout the year it gets worse during the fall time she is tried various OTC measures without success  Patient with a fair amount of anxiousness and anxiety related issues related to the loss of her dad she denies being depressed she does take her medicine she would like to have some take in the evening time to help her rest intermittently  Excessive daytime somnolence feeling tired fatigued rundown sleepy throughout the day she does snore at night she does not know she has any breathing issues this been going on for a while  Review of Systems  Constitutional: Negative for activity change and fever.  HENT: Positive for congestion, ear pain and rhinorrhea.   Eyes: Negative for discharge.  Respiratory: Positive for cough. Negative for shortness of breath and wheezing.   Cardiovascular: Negative for chest pain.  Neurological: Positive for headaches.       Objective:   Physical Exam  Constitutional: She appears well-developed.  HENT:  Head: Normocephalic.  Nose: Nose normal.  Mouth/Throat: Oropharynx is clear and moist. No oropharyngeal exudate.  Neck: Neck supple.  Cardiovascular: Normal rate and normal heart  sounds.  No murmur heard. Pulmonary/Chest: Effort normal and breath sounds normal. She has no wheezes.  Lymphadenopathy:    She has no cervical adenopathy.  Skin: Skin is warm and dry.  Nursing note and vitals reviewed.         Assessment & Plan:  Epigastric pain gastritis PPI indicated  Sinusitis antibiotic prescribed warning signs discussed  Significant anxiety stress since her dad died 2022/07/01 use Xanax as needed only at home continue antidepressant patient denies being depressed  Elevated liver enzymes-dad died of cirrhosis patient will try to find out additional information regarding of dad's underlying condition  Daytime somnolence we will have her fill out a [place] questionnaire and go from there  Dyshidrotic eczema Elocon cream twice daily as needed help keep it under control  25 minutes was spent with the patient.  This statement verifies that 25 minutes was indeed spent with the patient.  More than 50% of this visit-total duration of the visit-was spent in counseling and coordination of care. The issues that the patient came in for today as reflected in the diagnosis (s) please refer to documentation for further details.

## 2017-12-13 ENCOUNTER — Other Ambulatory Visit: Payer: Self-pay | Admitting: Family Medicine

## 2017-12-13 DIAGNOSIS — R748 Abnormal levels of other serum enzymes: Secondary | ICD-10-CM

## 2017-12-13 LAB — BASIC METABOLIC PANEL
BUN/Creatinine Ratio: 23 (ref 9–23)
BUN: 16 mg/dL (ref 6–24)
CHLORIDE: 103 mmol/L (ref 96–106)
CO2: 26 mmol/L (ref 20–29)
Calcium: 9.6 mg/dL (ref 8.7–10.2)
Creatinine, Ser: 0.7 mg/dL (ref 0.57–1.00)
GFR calc Af Amer: 116 mL/min/{1.73_m2} (ref >59)
GFR calc non Af Amer: 101 mL/min/{1.73_m2} (ref >59)
GLUCOSE: 89 mg/dL (ref 65–99)
POTASSIUM: 4.6 mmol/L (ref 3.5–5.2)
SODIUM: 142 mmol/L (ref 134–144)

## 2017-12-13 LAB — CBC WITH DIFFERENTIAL/PLATELET
Basophils Absolute: 0.1 10*3/uL (ref 0.0–0.2)
Basos: 1 %
EOS (ABSOLUTE): 0.2 10*3/uL (ref 0.0–0.4)
Eos: 2 %
HEMOGLOBIN: 13 g/dL (ref 11.1–15.9)
Hematocrit: 40.4 % (ref 34.0–46.6)
IMMATURE GRANS (ABS): 0 10*3/uL (ref 0.0–0.1)
IMMATURE GRANULOCYTES: 1 %
LYMPHS: 33 %
Lymphocytes Absolute: 2.8 10*3/uL (ref 0.7–3.1)
MCH: 26.3 pg — AB (ref 26.6–33.0)
MCHC: 32.2 g/dL (ref 31.5–35.7)
MCV: 82 fL (ref 79–97)
MONOCYTES: 8 %
Monocytes Absolute: 0.7 10*3/uL (ref 0.1–0.9)
NEUTROS ABS: 4.6 10*3/uL (ref 1.4–7.0)
NEUTROS PCT: 55 %
Platelets: 279 10*3/uL (ref 150–450)
RBC: 4.94 x10E6/uL (ref 3.77–5.28)
RDW: 13.4 % (ref 12.3–15.4)
WBC: 8.4 10*3/uL (ref 3.4–10.8)

## 2017-12-13 LAB — HEPATIC FUNCTION PANEL
ALT: 43 IU/L — AB (ref 0–32)
AST: 28 IU/L (ref 0–40)
Albumin: 4.5 g/dL (ref 3.5–5.5)
Alkaline Phosphatase: 109 IU/L (ref 39–117)
Bilirubin, Direct: 0.09 mg/dL (ref 0.00–0.40)
Total Protein: 6.8 g/dL (ref 6.0–8.5)

## 2017-12-13 LAB — LIPASE: LIPASE: 24 U/L (ref 14–72)

## 2017-12-13 NOTE — Progress Notes (Signed)
Questionnaire mailed to patient. Patient notified and verbalized understanding.

## 2017-12-13 NOTE — Progress Notes (Signed)
[place] questionnaire sent to patient. Left message to return call

## 2017-12-17 ENCOUNTER — Other Ambulatory Visit: Payer: Self-pay | Admitting: *Deleted

## 2017-12-17 ENCOUNTER — Telehealth: Payer: Self-pay | Admitting: *Deleted

## 2017-12-17 DIAGNOSIS — R748 Abnormal levels of other serum enzymes: Secondary | ICD-10-CM

## 2017-12-17 NOTE — Telephone Encounter (Signed)
Note sent to nurses from dr scott on paper to do liver, hep c ab, hep b surf ag, tibc, ferritin, ceruloplasmin, smooth muscle ab and liver ultrasound due to elevated liver enzymes. Orders put in for all and pt aware bw orders are in and she can go to [facility] to do and she will be called with liver ultrasound appt.

## 2017-12-21 ENCOUNTER — Ambulatory Visit (HOSPITAL_COMMUNITY)
Admission: RE | Admit: 2017-12-21 | Discharge: 2017-12-21 | Disposition: A | Discharge status: Home / Self Care | Payer: BLUE CROSS/BLUE SHIELD | Source: Ambulatory Visit | Attending: Family Medicine | Admitting: Family Medicine

## 2017-12-21 DIAGNOSIS — R932 Abnormal findings on diagnostic imaging of liver and biliary tract: Secondary | ICD-10-CM | POA: Diagnosis not present

## 2017-12-21 DIAGNOSIS — R748 Abnormal levels of other serum enzymes: Secondary | ICD-10-CM | POA: Diagnosis not present

## 2017-12-21 DIAGNOSIS — Z9049 Acquired absence of other specified parts of digestive tract: Secondary | ICD-10-CM | POA: Diagnosis not present

## 2017-12-23 ENCOUNTER — Encounter: Payer: Self-pay | Admitting: Family Medicine

## 2017-12-23 LAB — IRON AND TIBC
IRON: 83 ug/dL (ref 27–159)
Iron Saturation: 25 % (ref 15–55)
Total Iron Binding Capacity: 328 ug/dL (ref 250–450)
UIBC: 245 ug/dL (ref 131–425)

## 2017-12-23 LAB — MITOCHONDRIAL/SMOOTH MUSCLE AB PNL
Mitochondrial Ab: <20 Units (ref 0.0–20.0)
Smooth Muscle Ab: 2 Units (ref 0–19)

## 2017-12-23 LAB — FERRITIN: Ferritin: 41 ng/mL (ref 15–150)

## 2017-12-23 LAB — CERULOPLASMIN: CERULOPLASMIN: 26.6 mg/dL (ref 19.0–39.0)

## 2017-12-23 LAB — HEPATIC FUNCTION PANEL
ALT: 55 IU/L — AB (ref 0–32)
AST: 32 IU/L (ref 0–40)
Albumin: 4.4 g/dL (ref 3.5–5.5)
Alkaline Phosphatase: 108 IU/L (ref 39–117)
BILIRUBIN, DIRECT: 0.12 mg/dL (ref 0.00–0.40)
Bilirubin Total: 0.4 mg/dL (ref 0.0–1.2)
TOTAL PROTEIN: 6.7 g/dL (ref 6.0–8.5)

## 2017-12-23 LAB — HEPATITIS C ANTIBODY

## 2017-12-23 LAB — HEPATITIS B SURFACE ANTIGEN: Hepatitis B Surface Ag: NEGATIVE

## 2017-12-24 NOTE — Addendum Note (Signed)
Addended by: Meredith Leeds on: 12/24/2017 10:15 AM   Modules accepted: Orders

## 2018-01-08 ENCOUNTER — Encounter: Payer: Self-pay | Admitting: Orthopaedic Surgery

## 2018-01-08 ENCOUNTER — Ambulatory Visit: Payer: BLUE CROSS/BLUE SHIELD | Admitting: Orthopaedic Surgery

## 2018-01-08 ENCOUNTER — Ambulatory Visit (INDEPENDENT_AMBULATORY_CARE_PROVIDER_SITE_OTHER): Payer: BLUE CROSS/BLUE SHIELD

## 2018-01-08 VITALS — BP 137/88 | HR 78 | Ht 63.0 in | Wt 273.0 lb

## 2018-01-08 DIAGNOSIS — F17210 Nicotine dependence, cigarettes, uncomplicated: Secondary | ICD-10-CM

## 2018-01-08 DIAGNOSIS — M79671 Pain in right foot: Secondary | ICD-10-CM | POA: Diagnosis not present

## 2018-01-08 DIAGNOSIS — Z6841 Body Mass Index (BMI) 40.0 and over, adult: Secondary | ICD-10-CM | POA: Diagnosis not present

## 2018-01-08 MED ORDER — HYDROCODONE-ACETAMINOPHEN 5-325 MG PO TABS: ORAL_TABLET | ORAL | 0 refills | Status: DC

## 2018-01-08 NOTE — Progress Notes (Signed)
Patient Megan Parker, female DOB:Feb 12, 1966, 52 y.o. NFA:[id number]  Chief Complaint  Patient presents with  . Foot Pain    right    HPI  Megan Parker is a 52 y.o. female who dropped a sledge hammer on her right foot earlier this morning, just a short time ago.  She was using it to break up cement block.  She was wearing tennis shoes.  She has no other injury.   Body mass index is 48.36 kg/m.  The patient meets the AMA guidelines for Morbid (severe) obesity with a BMI > 40.0 and I have recommended weight loss.   ROS  Review of Systems  Constitutional: Positive for activity change.  Musculoskeletal: Positive for gait problem and joint swelling.  All other systems reviewed and are negative.   All other systems reviewed and are negative.  The following is a summary of the past history medically, past history surgically, known current medicines, social history and family history.  This information is gathered electronically by the computer from prior information and documentation.  I review this each visit and have found including this information at this point in the chart is beneficial and informative.    History reviewed. No pertinent past medical history.  Past Surgical History:  Procedure Laterality Date  . CHOLECYSTECTOMY    . TUBAL LIGATION      Family History  Problem Relation Age of Onset  . Thyroid disease Mother   . Heart attack Father   . Hyperlipidemia Father   . Thyroid disease Sister     Social History Social History   Tobacco Use  . Smoking status: Current Every Day Smoker    Packs/day: 1.00    Years: 26.00    Pack years: 26.00  . Smokeless tobacco: Never Used  Substance Use Topics  . Alcohol use: No  . Drug use: No    Allergies  Allergen Reactions  . Codeine Itching  . Levaquin [Levofloxacin] Other (See Comments)    Reaction unknown to patient   . Wellbutrin [Bupropion]     Made her feel sick    Current Outpatient Medications   Medication Sig Dispense Refill  . albuterol (PROVENTIL HFA;VENTOLIN HFA) 108 (90 Base) MCG/ACT inhaler Inhale 2 puffs into the lungs every 4 (four) hours as needed for wheezing. 1 Inhaler 6  . albuterol (PROVENTIL) (2.5 MG/3ML) 0.083% nebulizer solution Use via neb q 4 hrs prn wheezing 25 vial 12  . ALPRAZolam (XANAX) 0.5 MG tablet Take 1 tablet (0.5 mg total) by mouth 2 (two) times daily as needed for anxiety or sleep. 30 tablet 1  . amoxicillin-clavulanate (AUGMENTIN) 875-125 MG tablet Take 1 tablet by mouth 2 (two) times daily. 20 tablet 0  . chlorzoxazone (PARAFON FORTE DSC) 500 MG tablet Take 1 tablet (500 mg total) by mouth 3 (three) times daily as needed for muscle spasms. 30 tablet 1  . FLUoxetine (PROZAC) 20 MG capsule TAKE 2 CAPSULES BY MOUTH DAILY 60 capsule 2  . fluticasone (FLOVENT HFA) 220 MCG/ACT inhaler Inhale 2 puffs into the lungs 2 (two) times daily. 1 Inhaler 12  . Fluticasone-Salmeterol (ADVAIR DISKUS) 250-50 MCG/DOSE AEPB Inhale 1 puff into the lungs 2 (two) times daily. 60 each 5  . mometasone (ASMANEX 60 METERED DOSES) 220 MCG/INH inhaler Inhale 2 puffs into the lungs 2 (two) times daily. 1 Inhaler 12  . mometasone (ELOCON) 0.1 % cream Apply to affected area twice daily prn 45 g 2  . naproxen sodium (ANAPROX) 550 MG  tablet TAKE 1 TABLET BY MOUTH TWICE DAILY WITH FOOD 60 tablet 0  . pantoprazole (PROTONIX) 40 MG tablet Take 1 tablet (40 mg total) by mouth daily. 30 tablet 3  . HYDROcodone-acetaminophen (NORCO/VICODIN) 5-325 MG tablet One tablet every four hours as needed for acute pain.  Limit of five days per [place]. 30 tablet 0   No current facility-administered medications for this visit.      Physical Exam  Blood pressure 137/88, pulse 78, height 5\' 3"  (1.6 m), weight 273 lb (123.8 kg).  Constitutional: overall normal hygiene, normal nutrition, well developed, normal grooming, normal body habitus. Assistive device:none  Musculoskeletal: gait and station  Limp right, muscle tone and strength are normal, no tremors or atrophy is present.  .  Neurological: coordination overall normal.  Deep tendon reflex/nerve stretch intact.  Sensation normal.  Cranial nerves II-XII intact.   Skin:   Normal overall no scars, lesions, ulcers or rashes. No psoriasis.  Psychiatric: Alert and oriented x 3.  Recent memory intact, remote memory unclear.  Normal mood and affect. Well groomed.  Good eye contact.  Cardiovascular: overall no swelling, no varicosities, no edema bilaterally, normal temperatures of the legs and arms, no clubbing, cyanosis and good capillary refill.  Lymphatic: palpation is normal.  Right foot with some swelling dorsally and around base of toes on the right.  No ecchymosis or redness.  NV intact.  Painful area around metatarsal heads.  No wound or open area is present.  All other systems reviewed and are negative   The patient has been educated about the nature of the problem(s) and counseled on treatment options.  The patient appeared to understand what I have discussed and is in agreement with it.  Encounter Diagnoses  Name Primary?  . Acute pain of right foot Yes  . Cigarette nicotine dependence without complication   . Body mass index 45.0-49.9, adult (HCC)   . Morbid obesity (HCC)     X-rays were done of the right foot, reported separately.  Negative. PLAN Call if any problems.  Precautions discussed.  Continue current medications.   Return to clinic 2 weeks   I have given sheet of instructions for Contrast Baths.  I have reviewed the [place] Cont[place] Substance Reporting System web site prior to prescribing narcotic medicine for this patient.   Electronically Signed Darreld McleanWayne Brenlynn Fake, MD 11/19/201910:05 AM

## 2018-01-08 NOTE — Patient Instructions (Signed)
Steps to Quit Smoking Smoking tobacco can be bad for your health. It can also affect almost every organ in your body. Smoking puts you and people around you at risk for many serious long-lasting (chronic) diseases. Quitting smoking is hard, but it is one of the best things that you can do for your health. It is never too late to quit. What are the benefits of quitting smoking? When you quit smoking, you lower your risk for getting serious diseases and conditions. They can include:  Lung cancer or lung disease.  Heart disease.  Stroke.  Heart attack.  Not being able to have children (infertility).  Weak bones (osteoporosis) and broken bones (fractures).  If you have coughing, wheezing, and shortness of breath, those symptoms may get better when you quit. You may also get sick less often. If you are pregnant, quitting smoking can help to lower your chances of having a baby of low birth weight. What can I do to help me quit smoking? Talk with your doctor about what can help you quit smoking. Some things you can do (strategies) include:  Quitting smoking totally, instead of slowly cutting back how much you smoke over a period of time.  Going to in-person counseling. You are more likely to quit if you go to many counseling sessions.  Using resources and support systems, such as: ? Online chats with a counselor. ? Phone quitlines. ? Printed self-help materials. ? Support groups or group counseling. ? Text messaging programs. ? Mobile phone apps or applications.  Taking medicines. Some of these medicines may have nicotine in them. If you are pregnant or breastfeeding, do not take any medicines to quit smoking unless your doctor says it is okay. Talk with your doctor about counseling or other things that can help you.  Talk with your doctor about using more than one strategy at the same time, such as taking medicines while you are also going to in-person counseling. This can help make  quitting easier. What things can I do to make it easier to quit? Quitting smoking might feel very hard at first, but there is a lot that you can do to make it easier. Take these steps:  Talk to your family and friends. Ask them to support and encourage you.  Call phone quitlines, reach out to support groups, or work with a counselor.  Ask people who smoke to not smoke around you.  Avoid places that make you want (trigger) to smoke, such as: ? Bars. ? Parties. ? Smoke-break areas at work.  Spend time with people who do not smoke.  Lower the stress in your life. Stress can make you want to smoke. Try these things to help your stress: ? Getting regular exercise. ? Deep-breathing exercises. ? Yoga. ? Meditating. ? Doing a body scan. To do this, close your eyes, focus on one area of your body at a time from head to toe, and notice which parts of your body are tense. Try to relax the muscles in those areas.  Download or buy apps on your mobile phone or tablet that can help you stick to your quit plan. There are many free apps, such as QuitGuide from the CDC (Centers for Disease Control and Prevention). You can find more support from smokefree.gov and other websites.  This information is not intended to replace advice given to you by your health care provider. Make sure you discuss any questions you have with your health care provider. Document Released: 12/03/2008 Document   Revised: 10/05/2015 Document Reviewed: 06/23/2014 Elsevier Interactive Patient Education  2018 Elsevier Inc.  

## 2018-01-22 ENCOUNTER — Ambulatory Visit: Payer: BLUE CROSS/BLUE SHIELD | Admitting: Orthopaedic Surgery

## 2018-01-28 ENCOUNTER — Encounter: Payer: Self-pay | Admitting: Family Medicine

## 2018-01-28 ENCOUNTER — Other Ambulatory Visit: Payer: Self-pay | Admitting: *Deleted

## 2018-01-28 ENCOUNTER — Ambulatory Visit: Payer: BLUE CROSS/BLUE SHIELD | Admitting: Family Medicine

## 2018-01-28 VITALS — BP 128/84 | HR 93 | Temp 99.1°F | Ht 63.0 in | Wt 261.0 lb

## 2018-01-28 DIAGNOSIS — J45998 Other asthma: Secondary | ICD-10-CM | POA: Diagnosis not present

## 2018-01-28 MED ORDER — ALBUTEROL SULFATE (2.5 MG/3ML) 0.083% IN NEBU: INHALATION_SOLUTION | RESPIRATORY_TRACT | 12 refills | Status: DC

## 2018-01-28 MED ORDER — PREDNISONE 20 MG PO TABS: ORAL_TABLET | ORAL | 0 refills | Status: DC

## 2018-01-28 MED ORDER — ALBUTEROL SULFATE HFA 108 (90 BASE) MCG/ACT IN AERS: 2.0000 | INHALATION_SPRAY | RESPIRATORY_TRACT | 6 refills | Status: DC | PRN

## 2018-01-28 MED ORDER — AZITHROMYCIN 250 MG PO TABS: ORAL_TABLET | ORAL | 0 refills | Status: DC

## 2018-01-28 NOTE — Progress Notes (Signed)
   Subjective:    Patient ID: Megan Parker, female    DOB: 02/01/1966, 52 y.o.   MRN: [id number]  Sinusitis  This is a new problem. Episode onset: 4 days. Associated symptoms include coughing, ear pain, headaches and a sore throat. (Vomiting, diarrhea, wheezing, fever) Treatments tried: otc cold and flu med.   Thursday afternoon started with sore throat and cough, had body aches and chills. Reports was in bed all weekend. Reports having trouble with wheezing, has been taking advair and albuterol inhaler without much relief. Albuterol tid yesterday, none today. Reports h/a all over. Also reports N/V/D. Reports vomiting twice yesterday, none today. Diarrhea once today, multiple times yesterday Reports decreased appetite and energy levels. Reports rib pain from increased coughing.    Works in a nursing home. Co-worker may have had the flu.   Has not smoked since getting sick. Plans to not pick it back up once she's better.   Review of Systems  HENT: Positive for ear pain and sore throat.   Respiratory: Positive for cough.   Neurological: Positive for headaches.       Objective:   Physical Exam  Constitutional: She is oriented to person, place, and time. She appears ill.  HENT:  Head: Normocephalic and atraumatic.  Right Ear: Tympanic membrane normal.  Left Ear: Tympanic membrane normal.  Nose: Nose normal.  Mouth/Throat: Uvula is midline and oropharynx is clear and moist.  Eyes: Right eye exhibits no discharge. Left eye exhibits no discharge.  Neck: Neck supple.  Cardiovascular: Normal rate, regular rhythm and normal heart sounds.  Pulmonary/Chest: She has wheezes.  Lymphadenopathy:    She has no cervical adenopathy.  Neurological: She is alert and oriented to person, place, and time.  Skin: Skin is warm and dry.  Nursing note and vitals reviewed.      Assessment & Plan:  Post-viral reactive airway disease Discussed likely post influenza.  Will treat with a course of prednisone  taper.  Recommend regular use of her albuterol inhaler as needed.  We will also treat presumptively with a Z-Pak to ensure no secondary bacterial infection.  Warning signs discussed with patient.  Follow-up if symptoms worsen or fail to improve.  Dr. Lilyan PuntScott Luking was consulted on this case and is in agreement with the above treatment plan.

## 2018-01-28 NOTE — Patient Instructions (Signed)
Asthma, Acute Bronchospasm °Acute bronchospasm caused by asthma is also referred to as an asthma attack. Bronchospasm means your air passages become narrowed. The narrowing is caused by inflammation and tightening of the muscles in the air tubes (bronchi) in your lungs. This can make it hard to breathe or cause you to wheeze and cough. °What are the causes? °Possible triggers are: °· Animal dander from the skin, hair, or feathers of animals. °· Dust mites contained in house dust. °· Cockroaches. °· Pollen from trees or grass. °· Mold. °· Cigarette or tobacco smoke. °· Air pollutants such as dust, household cleaners, hair sprays, aerosol sprays, paint fumes, strong chemicals, or strong odors. °· Cold air or weather changes. Cold air may trigger inflammation. Winds increase molds and pollens in the air. °· Strong emotions such as crying or laughing hard. °· Stress. °· Certain medicines such as aspirin or beta-blockers. °· Sulfites in foods and drinks, such as dried fruits and wine. °· Infections or inflammatory conditions, such as a flu, cold, or inflammation of the nasal membranes (rhinitis). °· Gastroesophageal reflux disease (GERD). GERD is a condition where stomach acid backs up into your esophagus. °· Exercise or strenuous activity. ° °What are the signs or symptoms? °· Wheezing. °· Excessive coughing, particularly at night. °· Chest tightness. °· Shortness of breath. °How is this diagnosed? °Your health care provider will ask you about your medical history and perform a physical exam. A chest X-ray or blood testing may be performed to look for other causes of your symptoms or other conditions that may have triggered your asthma attack. °How is this treated? °Treatment is aimed at reducing inflammation and opening up the airways in your lungs. Most asthma attacks are treated with inhaled medicines. These include quick relief or rescue medicines (such as bronchodilators) and controller medicines (such as inhaled  corticosteroids). These medicines are sometimes given through an inhaler or a nebulizer. Systemic steroid medicine taken by mouth or given through an IV tube also can be used to reduce the inflammation when an attack is moderate or severe. Antibiotic medicines are only used if a bacterial infection is present. °Follow these instructions at home: °· Rest. °· Drink plenty of liquids. This helps the mucus to remain thin and be easily coughed up. Only use caffeine in moderation and do not use alcohol until you have recovered from your illness. °· Do not smoke. Avoid being exposed to secondhand smoke. °· You play a critical role in keeping yourself in good health. Avoid exposure to things that cause you to wheeze or to have breathing problems. °· Keep your medicines up-to-date and available. Carefully follow your health care provider’s treatment plan. °· Take your medicine exactly as prescribed. °· When pollen or pollution is bad, keep windows closed and use an air conditioner or go to places with air conditioning. °· Asthma requires careful medical care. See your health care provider for a follow-up as advised. If you are more than [redacted] weeks pregnant and you were prescribed any new medicines, let your obstetrician know about the visit and how you are doing. Follow up with your health care provider as directed. °· After you have recovered from your asthma attack, make an appointment with your outpatient doctor to talk about ways to reduce the likelihood of future attacks. If you do not have a doctor who manages your asthma, make an appointment with a primary care doctor to discuss your asthma. °Get help right away if: °· You are getting worse. °·   You have trouble breathing. If severe, call your local emergency services (911 in the [place] °· You develop chest pain or discomfort. °· You are vomiting. °· You are not able to keep fluids down. °· You are coughing up yellow, green, brown, or bloody sputum. °· You have a fever  and your symptoms suddenly get worse. °· You have trouble swallowing. °This information is not intended to replace advice given to you by your health care provider. Make sure you discuss any questions you have with your health care provider. °Document Released: 05/24/2006 Document Revised: 07/21/2015 Document Reviewed: 08/14/2012 °Elsevier Interactive Patient Education © 2017 Elsevier Inc. ° °

## 2018-01-30 NOTE — Telephone Encounter (Signed)
1.  Patient may not be aware that anti-inflammatories with antidepressants can increase risk of ulcers If she is using the anti-inflammatory I recommend she use it sparingly She also needs to be educated about potential ulcer warning signs including stomach pain rectal bleeding black tarry stools or coffee-ground emesis

## 2018-01-31 NOTE — Telephone Encounter (Signed)
It would be fine to go ahead and send in 3 refills

## 2018-01-31 NOTE — Telephone Encounter (Signed)
Patient is aware. May I send this in ?

## 2018-02-01 MED ORDER — NAPROXEN SODIUM 550 MG PO TABS: 550.0000 mg | ORAL_TABLET | Freq: Two times a day (BID) | ORAL | 2 refills | Status: DC

## 2018-04-15 ENCOUNTER — Ambulatory Visit: Payer: BLUE CROSS/BLUE SHIELD | Admitting: Family Medicine

## 2018-04-27 ENCOUNTER — Other Ambulatory Visit: Payer: Self-pay | Admitting: Family Medicine

## 2018-04-29 ENCOUNTER — Encounter: Payer: Self-pay | Admitting: Family Medicine

## 2018-04-29 ENCOUNTER — Ambulatory Visit: Payer: 59 | Admitting: Family Medicine

## 2018-04-29 VITALS — BP 128/80 | Ht 63.0 in | Wt 267.0 lb

## 2018-04-29 DIAGNOSIS — M659 Synovitis and tenosynovitis, unspecified: Secondary | ICD-10-CM

## 2018-04-29 MED ORDER — AMOXICILLIN-POT CLAVULANATE 875-125 MG PO TABS: 1.0000 | ORAL_TABLET | Freq: Two times a day (BID) | ORAL | 0 refills | Status: DC

## 2018-04-29 MED ORDER — PREDNISONE 20 MG PO TABS: ORAL_TABLET | ORAL | 0 refills | Status: DC

## 2018-04-29 NOTE — Patient Instructions (Signed)
dequearvein's tenosynovitis--this is inflammation of tendon on the inside part of the wrist

## 2018-04-29 NOTE — Progress Notes (Signed)
   Subjective:    Patient ID: Megan Parker, female    DOB: 06-Apr-1965, 53 y.o.   MRN: [id number]  Hand Pain   Incident onset: one month. There was no injury mechanism. Pain location: right hand and wrist. She has tried acetaminophen (hand brace) for the symptoms.    Hand swollen and very painful  Certain motions causes pain  Has had sig swelling   Pt wore a hand brae did not help  Worse now than before   Notes a knowt on the thuumb  Taking no oral meds  Patient also does have productive bronchial cough.  Along with substantial wheezing  Review of Systems No headache, no major weight loss or weight gain, no chest pain no back pain abdominal pain no change in bowel habits complete ROS otherwise negative     Objective:   Physical Exam  Alert no major distress mild nasal congestion lungs bilateral expiratory wheezes no tachypnea heart rate and rhythm right wrist impressive de Quervain's tendinitis with positive Finkelstein's test      Assessment & Plan:  Impression de Quervain's tenosynovitis with positive Finkelstein test/exacerbation asthma plan prednisone taper.  Maintain wrist splint.  If persists may well need orthopedic referral for injection discussed

## 2018-04-29 NOTE — Telephone Encounter (Signed)
This +2 refills needs office visit 

## 2018-05-09 ENCOUNTER — Other Ambulatory Visit: Payer: Self-pay

## 2018-05-09 ENCOUNTER — Emergency Department (HOSPITAL_COMMUNITY)
Admission: EM | Admit: 2018-05-09 | Discharge: 2018-05-09 | Disposition: A | Discharge status: Home / Self Care | Payer: 59 | Attending: Emergency Medicine | Admitting: Emergency Medicine

## 2018-05-09 ENCOUNTER — Telehealth: Payer: Self-pay | Admitting: *Deleted

## 2018-05-09 ENCOUNTER — Emergency Department (HOSPITAL_COMMUNITY): Payer: 59

## 2018-05-09 ENCOUNTER — Encounter (HOSPITAL_COMMUNITY): Payer: Self-pay

## 2018-05-09 DIAGNOSIS — J45909 Unspecified asthma, uncomplicated: Secondary | ICD-10-CM | POA: Insufficient documentation

## 2018-05-09 DIAGNOSIS — K529 Noninfective gastroenteritis and colitis, unspecified: Secondary | ICD-10-CM | POA: Insufficient documentation

## 2018-05-09 DIAGNOSIS — Z79899 Other long term (current) drug therapy: Secondary | ICD-10-CM | POA: Diagnosis not present

## 2018-05-09 DIAGNOSIS — F17210 Nicotine dependence, cigarettes, uncomplicated: Secondary | ICD-10-CM | POA: Diagnosis not present

## 2018-05-09 DIAGNOSIS — R109 Unspecified abdominal pain: Secondary | ICD-10-CM | POA: Diagnosis not present

## 2018-05-09 DIAGNOSIS — R1084 Generalized abdominal pain: Secondary | ICD-10-CM | POA: Diagnosis present

## 2018-05-09 HISTORY — DX: Unspecified asthma, uncomplicated: J45.909

## 2018-05-09 LAB — COMPREHENSIVE METABOLIC PANEL
ALT: 69 U/L — ABNORMAL HIGH (ref 0–44)
AST: 39 U/L (ref 15–41)
Albumin: 3.7 g/dL (ref 3.5–5.0)
Alkaline Phosphatase: 83 U/L (ref 38–126)
Anion gap: 9 (ref 5–15)
BUN: 15 mg/dL (ref 6–20)
CO2: 26 mmol/L (ref 22–32)
Calcium: 8.8 mg/dL — ABNORMAL LOW (ref 8.9–10.3)
Chloride: 105 mmol/L (ref 98–111)
Creatinine, Ser: 0.75 mg/dL (ref 0.44–1.00)
GFR calc Af Amer: >60 mL/min (ref >60)
GFR calc non Af Amer: >60 mL/min (ref >60)
Glucose, Bld: 96 mg/dL (ref 70–99)
Potassium: 3.9 mmol/L (ref 3.5–5.1)
Sodium: 140 mmol/L (ref 135–145)
Total Bilirubin: 0.3 mg/dL (ref 0.3–1.2)
Total Protein: 6.2 g/dL — ABNORMAL LOW (ref 6.5–8.1)

## 2018-05-09 LAB — URINALYSIS, ROUTINE W REFLEX MICROSCOPIC
Bacteria, UA: NONE SEEN
Bilirubin Urine: NEGATIVE
Glucose, UA: NEGATIVE mg/dL
Hgb urine dipstick: NEGATIVE
KETONES UR: NEGATIVE mg/dL
Nitrite: NEGATIVE
Protein, ur: NEGATIVE mg/dL
Specific Gravity, Urine: 1.018 (ref 1.005–1.030)
pH: 5 (ref 5.0–8.0)

## 2018-05-09 LAB — CBC WITH DIFFERENTIAL/PLATELET
Abs Immature Granulocytes: 0.07 10*3/uL (ref 0.00–0.07)
Basophils Absolute: 0 10*3/uL (ref 0.0–0.1)
Basophils Relative: 0 %
EOS ABS: 0.2 10*3/uL (ref 0.0–0.5)
Eosinophils Relative: 3 %
HCT: 43.3 % (ref 36.0–46.0)
Hemoglobin: 13.6 g/dL (ref 12.0–15.0)
IMMATURE GRANULOCYTES: 1 %
Lymphocytes Relative: 36 %
Lymphs Abs: 2.9 10*3/uL (ref 0.7–4.0)
MCH: 26.5 pg (ref 26.0–34.0)
MCHC: 31.4 g/dL (ref 30.0–36.0)
MCV: 84.4 fL (ref 80.0–100.0)
Monocytes Absolute: 0.7 10*3/uL (ref 0.1–1.0)
Monocytes Relative: 8 %
NEUTROS PCT: 52 %
Neutro Abs: 4.3 10*3/uL (ref 1.7–7.7)
Platelets: 237 10*3/uL (ref 150–400)
RBC: 5.13 MIL/uL — ABNORMAL HIGH (ref 3.87–5.11)
RDW: 13.9 % (ref 11.5–15.5)
WBC: 8.1 10*3/uL (ref 4.0–10.5)
nRBC: 0 % (ref 0.0–0.2)

## 2018-05-09 LAB — PREGNANCY, URINE: Preg Test, Ur: NEGATIVE

## 2018-05-09 MED ORDER — KETOROLAC TROMETHAMINE 30 MG/ML IJ SOLN
30.0000 mg | Freq: Once | INTRAMUSCULAR | Status: AC
  Administered 2018-05-09: 30 mg via INTRAVENOUS
  Filled 2018-05-09: qty 1

## 2018-05-09 MED ORDER — SODIUM CHLORIDE 0.9 % IV BOLUS
1000.0000 mL | Freq: Once | INTRAVENOUS | Status: AC
  Administered 2018-05-09: 1000 mL via INTRAVENOUS

## 2018-05-09 MED ORDER — DICYCLOMINE HCL 20 MG PO TABS: ORAL_TABLET | ORAL | 0 refills | Status: DC

## 2018-05-09 NOTE — Telephone Encounter (Signed)
I agree

## 2018-05-09 NOTE — ED Notes (Signed)
Student PA at the bedside to assess

## 2018-05-09 NOTE — Discharge Instructions (Signed)
Drink plenty of fluids and follow-up next week if not improving °

## 2018-05-09 NOTE — ED Triage Notes (Signed)
Pt c/o of abdominal pain (8/10) and N/V/D that started on Sunday. Pt states she has had no fever, she feels really weak, has a headache, and sleeps a lot. Ate hamburger and fries last night and states that "it made my stomach hurt really bad". Pt states that pain is in lower stomach. Pt says she is currently nauseous and in a lot of pain.

## 2018-05-09 NOTE — ED Notes (Signed)
Feels like labor pain, in waves o pain up to 10 at times. Eating makes it worse.

## 2018-05-09 NOTE — Telephone Encounter (Signed)
Pt states she started having nausea, vomiting and diarrhea on Sunday. Diarrhea and vomiting are better but still having nausea and severe abdominal pain. States she has to bend over to breath and so much pain she can barely walk. Advised pt to go to ED. Pt states she will go to [facility].

## 2018-05-09 NOTE — ED Notes (Signed)
Patient given discharge instruction, verbalized understand. Patient ambulatory out of the department.  

## 2018-05-09 NOTE — ED Provider Notes (Signed)
[facility] EMERGENCY DEPARTMENT Provider Note   CSN: [id number] Arrival date & time: 05/09/18  0941    History   Chief Complaint Chief Complaint  Patient presents with   Abdominal Pain    HPI Megan Parker is a 53 y.o. female.     Patient complains of abdominal cramping and diarrhea.  The history is provided by the patient. No language interpreter was used.  Abdominal Pain  Pain location:  Generalized Pain quality: aching   Pain radiates to:  Does not radiate Pain severity:  Mild Onset quality:  Sudden Timing:  Constant Progression:  Waxing and waning Chronicity:  New Context: not alcohol use   Relieved by:  Nothing Associated symptoms: diarrhea   Associated symptoms: no chest pain, no cough, no fatigue and no hematuria     Past Medical History:  Diagnosis Date   Asthma     Patient Active Problem List   Diagnosis Date Noted   Reactive airway disease 03/20/2017   Insomnia 09/01/2014   Encounter for long-term opiate analgesic use 09/01/2014   Menorrhagia with regular cycle 06/06/2014   Dyshidrotic eczema 06/01/2014   Morbid obesity (HCC) 12/31/2013   Perimenopause 09/30/2013   Asthma, chronic 03/31/2013   Chronic low back pain 12/11/2012   Pain management 12/11/2012    Past Surgical History:  Procedure Laterality Date   CHOLECYSTECTOMY     TUBAL LIGATION       OB History   No obstetric history on file.      Home Medications    Prior to Admission medications   Medication Sig Start Date End Date Taking? Authorizing Provider  albuterol (PROVENTIL HFA;VENTOLIN HFA) 108 (90 Base) MCG/ACT inhaler Inhale 2 puffs into the lungs every 4 (four) hours as needed for wheezing. 01/28/18  Yes Jeannine Boga, NP  albuterol (PROVENTIL) (2.5 MG/3ML) 0.083% nebulizer solution Use via neb q 4 hrs prn wheezing 01/28/18  Yes Jeannine Boga, NP  ALPRAZolam Prudy Feeler) 0.5 MG tablet Take 1 tablet (0.5 mg total) by mouth 2 (two) times daily as needed for  anxiety or sleep. 12/12/17  Yes Luking, Jonna Coup, MD  FLUoxetine (PROZAC) 20 MG capsule TAKE 2 CAPSULES BY MOUTH DAILY 04/29/18  Yes Luking, Scott A, MD  Fluticasone-Salmeterol (ADVAIR DISKUS) 250-50 MCG/DOSE AEPB Inhale 1 puff into the lungs 2 (two) times daily. 06/20/17  Yes Campbell Riches, NP  mometasone (ELOCON) 0.1 % cream Apply to affected area twice daily prn 12/12/17 12/12/18 Yes Luking, Jonna Coup, MD  naproxen sodium (ANAPROX) 550 MG tablet Take 1 tablet (550 mg total) by mouth 2 (two) times daily with a meal. 02/01/18  Yes Luking, Scott A, MD  pantoprazole (PROTONIX) 40 MG tablet Take 1 tablet (40 mg total) by mouth daily. 12/12/17  Yes Babs Sciara, MD  amoxicillin-clavulanate (AUGMENTIN) 875-125 MG tablet Take 1 tablet by mouth 2 (two) times daily. Patient not taking: Reported on 05/09/2018 04/29/18   Merlyn Albert, MD  chlorzoxazone (PARAFON FORTE DSC) 500 MG tablet Take 1 tablet (500 mg total) by mouth 3 (three) times daily as needed for muscle spasms. Patient not taking: Reported on 05/09/2018 06/27/16   Babs Sciara, MD  dicyclomine (BENTYL) 20 MG tablet Take one every 6 hours as needed for abd cramps 05/09/18   Bethann Berkshire, MD  HYDROcodone-acetaminophen (NORCO/VICODIN) 5-325 MG tablet One tablet every four hours as needed for acute pain.  Limit of five days per [place]. Patient not taking: Reported on 05/09/2018 01/08/18  Darreld Mclean, MD  predniSONE (DELTASONE) 20 MG tablet Take 3 qd for 3 days, then 2 qd for 3 days, then one qd for 3 ays Patient not taking: Reported on 05/09/2018 04/29/18   Merlyn Albert, MD    Family History Family History  Problem Relation Age of Onset   Thyroid disease Mother    Heart attack Father    Hyperlipidemia Father    Thyroid disease Sister     Social History Social History   Tobacco Use   Smoking status: Current Every Day Smoker    Packs/day: 0.50    Years: 26.00    Pack years: 13.00    Types: Cigarettes    Last  attempt to quit: 01/24/2018    Years since quitting: 0.2   Smokeless tobacco: Never Used  Substance Use Topics   Alcohol use: No   Drug use: No     Allergies   Codeine; Levaquin [levofloxacin]; and Wellbutrin [bupropion]   Review of Systems Review of Systems  Constitutional: Negative for appetite change and fatigue.  HENT: Negative for congestion, ear discharge and sinus pressure.   Eyes: Negative for discharge.  Respiratory: Negative for cough.   Cardiovascular: Negative for chest pain.  Gastrointestinal: Positive for abdominal pain and diarrhea.  Genitourinary: Negative for frequency and hematuria.  Musculoskeletal: Negative for back pain.  Skin: Negative for rash.  Neurological: Negative for seizures and headaches.  Psychiatric/Behavioral: Negative for hallucinations.     Physical Exam Updated Vital Signs BP (!) 135/119    Pulse 75    Temp 98.6 F (37 C) (Oral)    Resp 16    Ht 5\' 3"  (1.6 m)    Wt 117.9 kg    SpO2 99%    BMI 46.06 kg/m   Physical Exam Vitals signs and nursing note reviewed.  Constitutional:      Appearance: She is well-developed.  HENT:     Head: Normocephalic.     Nose: Nose normal.  Eyes:     General: No scleral icterus.    Conjunctiva/sclera: Conjunctivae normal.  Neck:     Musculoskeletal: Neck supple.     Thyroid: No thyromegaly.  Cardiovascular:     Rate and Rhythm: Normal rate and regular rhythm.     Heart sounds: No murmur. No friction rub. No gallop.   Pulmonary:     Breath sounds: No stridor. No wheezing or rales.  Chest:     Chest wall: No tenderness.  Abdominal:     General: There is no distension.     Tenderness: There is no abdominal tenderness. There is no rebound.  Musculoskeletal: Normal range of motion.  Lymphadenopathy:     Cervical: No cervical adenopathy.  Skin:    Findings: No erythema or rash.  Neurological:     Mental Status: She is oriented to person, place, and time.     Motor: No abnormal muscle tone.      Coordination: Coordination normal.  Psychiatric:        Behavior: Behavior normal.      ED Treatments / Results  Labs (all labs ordered are listed, but only abnormal results are displayed) Labs Reviewed  CBC WITH DIFFERENTIAL/PLATELET - Abnormal; Notable for the following components:      Result Value   RBC 5.13 (*)    All other components within normal limits  COMPREHENSIVE METABOLIC PANEL - Abnormal; Notable for the following components:   Calcium 8.8 (*)    Total Protein 6.2 (*)  ALT 69 (*)    All other components within normal limits  URINALYSIS, ROUTINE W REFLEX MICROSCOPIC - Abnormal; Notable for the following components:   APPearance HAZY (*)    Leukocytes,Ua TRACE (*)    All other components within normal limits  URINE CULTURE  PREGNANCY, URINE    EKG None  Radiology Dg Abd Acute 2+v W 1v Chest  Result Date: 05/09/2018 CLINICAL DATA:  Abdominal pain. EXAM: DG ABDOMEN ACUTE W/ 1V CHEST COMPARISON:  Ultrasound 12/21/2017.  Chest x-ray 06/13/2017. FINDINGS: No acute cardiopulmonary disease. Stable cardiomegaly. Chest is stable from prior exam. Surgical clips right upper quadrant. Several loops of slightly prominent air-filled small bowel noted. Colonic gas pattern is normal. Stool noted throughout the colon. These findings may be secondary to mild adynamic ileus. Partial small bowel obstruction can not be excluded and follow-up exam suggested to demonstrate resolution of small bowel distention. No free air. Pelvic calcification most consistent phleboliths. No acute bony abnormality. Thoracolumbar spine scoliosis. IMPRESSION: 1.  Stable cardiomegaly.  Chest is stable from prior exam. 2. Several air-filled loops of slightly prominent small bowel noted. Colonic gas pattern is normal. Stool noted throughout the colon. Although these changes may represent mild adynamic ileus, partial small bowel obstruction can not be excluded and follow-up exam suggested to demonstrate  resolution of small-bowel distention. Electronically Signed   By: Maisie Fus  Register   On: 05/09/2018 11:31    Procedures Procedures (including critical care time)  Medications Ordered in ED Medications  sodium chloride 0.9 % bolus 1,000 mL (0 mLs Intravenous Stopped 05/09/18 1230)  ketorolac (TORADOL) 30 MG/ML injection 30 mg (30 mg Intravenous Given 05/09/18 1318)     Initial Impression / Assessment and Plan / ED Course  I have reviewed the triage vital signs and the nursing notes.  Pertinent labs & imaging results that were available during my care of the patient were reviewed by me and considered in my medical decision making (see chart for details).        Patient with gastroenteritis.  She is given Bentyl and will follow-up as needed  Final Clinical Impressions(s) / ED Diagnoses   Final diagnoses:  Gastroenteritis    ED Discharge Orders         Ordered    dicyclomine (BENTYL) 20 MG tablet     05/09/18 1432           Bethann Berkshire, MD 05/09/18 1438

## 2018-05-11 LAB — URINE CULTURE: Culture: <10000 — AB

## 2018-05-28 ENCOUNTER — Other Ambulatory Visit: Payer: Self-pay | Admitting: Family Medicine

## 2018-06-26 ENCOUNTER — Other Ambulatory Visit: Payer: Self-pay | Admitting: Family Medicine

## 2018-06-27 NOTE — Telephone Encounter (Signed)
May have this +3 refills on each 

## 2018-09-27 ENCOUNTER — Ambulatory Visit (INDEPENDENT_AMBULATORY_CARE_PROVIDER_SITE_OTHER): Payer: 59 | Admitting: Nurse Practitioner

## 2018-09-27 ENCOUNTER — Encounter: Payer: Self-pay | Admitting: Nurse Practitioner

## 2018-09-27 ENCOUNTER — Other Ambulatory Visit: Payer: Self-pay

## 2018-09-27 DIAGNOSIS — F418 Other specified anxiety disorders: Secondary | ICD-10-CM | POA: Diagnosis not present

## 2018-09-27 MED ORDER — CLONAZEPAM 0.5 MG PO TABS: ORAL_TABLET | ORAL | 0 refills | Status: DC

## 2018-09-27 MED ORDER — TRAZODONE HCL 50 MG PO TABS: 25.0000 mg | ORAL_TABLET | Freq: Every evening | ORAL | 0 refills | Status: DC | PRN

## 2018-09-27 MED ORDER — FLUOXETINE HCL 20 MG PO CAPS: ORAL_CAPSULE | ORAL | 0 refills | Status: DC

## 2018-09-27 NOTE — Progress Notes (Signed)
Subjective:  PHONE CALL   Patient ID: Megan NeitherLisa A Rings, female    DOB: 01/02/1966, 53 y.o.   MRN: [id number]  HPI Depression. Doing a lot of sleeping, feels like crying a lot, cannot function. Has had to leave work and call out of work because she cannot function.  Takes prozac 20mg  2 daily currently. PHQ9 done.   Virtual Visit via Video Note  I connected with Megan Parker on 09/27/18 at 11:20 AM EDT by a video enabled telemedicine application and verified that I am speaking with the correct person using two identifiers. Attempted virtual but unable to connect; switched to phone visit  Location: Patient: home Provider: office   I discussed the limitations of evaluation and management by telemedicine and the availability of in person appointments. The patient expressed understanding and agreed to proceed.  History of Present Illness: Presents for complaints of depression which is progressively gotten worse lately.  Feels drained and tired all the time.  Wants to stay in her room and sleep.  Crying for no reason.  Irritability.  Trouble focusing.  For example patient states she will go in and run water for the dishes and forget to wash them.  Difficulty sleeping at nighttime due to racing thoughts.  Very little sleep.  Denies suicidal or homicidal thoughts or ideation. Depression screen PHQ 2/9 09/27/2018  Decreased Interest 3  Down, Depressed, Hopeless 3  PHQ - 2 Score 6  Altered sleeping 3  Tired, decreased energy 3  Change in appetite 3  Feeling bad or failure about yourself  3  Trouble concentrating 3  Moving slowly or fidgety/restless 3  Suicidal thoughts 0  PHQ-9 Score 24  Difficult doing work/chores Extremely dIfficult      Observations/Objective: Today's visit was via telephone Physical exam was not possible for this visit Alert, oriented.  Upset at times during phone visit.  Thoughts logical coherent and relevant.  Assessment and Plan: Problem List Items Addressed This Visit       Other   Depression with anxiety - Primary   Relevant Medications   FLUoxetine (PROZAC) 20 MG capsule   traZODone (DESYREL) 50 MG tablet     Meds ordered this encounter  Medications  . FLUoxetine (PROZAC) 20 MG capsule    Sig: Take 3 pills daily (60 mg total)    Dispense:  90 capsule    Refill:  0    Order Specific Question:   Supervising Provider    Answer:   Lilyan PuntLUKING, SCOTT A [[id number]]  . traZODone (DESYREL) 50 MG tablet    Sig: Take 0.5-1 tablets (25-50 mg total) by mouth at bedtime as needed for sleep.    Dispense:  30 tablet    Refill:  0    Order Specific Question:   Supervising Provider    Answer:   Lilyan PuntLUKING, SCOTT A [[id number]]  . clonazePAM (KLONOPIN) 0.5 MG tablet    Sig: Take 1/2-1 tab po BID prn anxiety    Dispense:  20 tablet    Refill:  0    Order Specific Question:   Supervising Provider    Answer:   Lilyan PuntLUKING, SCOTT A [[id number]]     Follow Up Instructions: Discussed options.  Increase Prozac to 60 mg daily.  If no improvement over the next 7 to 10 days, patient to call back to the office.  If symptoms worsen or new symptoms develop go back to previous dose and contact office.  Start trazodone as directed for sleep.  Reviewed  potential warning signs of serotonin syndrome.  DC med and call if any problems.  Given limited prescription for Klonopin to use only for extreme anxiety.  Start with half a tab as directed PRN. Return in about 2 weeks (around 10/11/2018) for virtual visit; recheck.    I discussed the assessment and treatment plan with the patient. The patient was provided an opportunity to ask questions and all were answered. The patient agreed with the plan and demonstrated an understanding of the instructions.   The patient was advised to call back or seek an in-person evaluation if the symptoms worsen or if the condition fails to improve as anticipated.  I provided 25 minutes of non-face-to-face time during this encounter.  25 minutes was spent with the patient.   This statement verifies that 25 minutes was indeed spent with the patient.  More than 50% of this visit-total duration of the visit-was spent in counseling and coordination of care. The issues that the patient came in for today as reflected in the diagnosis (s) please refer to documentation for further details.       Review of Systems     Objective:   Physical Exam        Assessment & Plan:

## 2018-10-11 ENCOUNTER — Other Ambulatory Visit: Payer: Self-pay

## 2018-10-11 ENCOUNTER — Ambulatory Visit (INDEPENDENT_AMBULATORY_CARE_PROVIDER_SITE_OTHER): Payer: 59 | Admitting: Nurse Practitioner

## 2018-10-11 DIAGNOSIS — F418 Other specified anxiety disorders: Secondary | ICD-10-CM

## 2018-10-11 MED ORDER — FLUOXETINE HCL 20 MG PO CAPS: ORAL_CAPSULE | ORAL | 2 refills | Status: DC

## 2018-10-11 MED ORDER — TRAZODONE HCL 50 MG PO TABS: 25.0000 mg | ORAL_TABLET | Freq: Every evening | ORAL | 2 refills | Status: DC | PRN

## 2018-10-11 NOTE — Progress Notes (Signed)
  VIRTUAL VISIT Subjective:    Patient ID: Megan Parker, female    DOB: 06/22/1965, 53 y.o.   MRN: [id number]  HPI Follow up on anxiety and depression. Taking prozac 20mg  3 pills daily and klonopin 0.5mg  prn. Started on meds 2 weeks ago.  Pt states she feels much better. Concerns about what time of day she should be taking meds.   Virtual Visit via Video Note  I connected with Megan Parker on 10/11/18 at 10:20 AM EDT by a video enabled telemedicine application and verified that I am speaking with the correct person using two identifiers.  Location: Patient: home Provider: school   I discussed the limitations of evaluation and management by telemedicine and the availability of in person appointments. The patient expressed understanding and agreed to proceed.  History of Present Illness: Presents for recheck. See previous note. Remains under tremendous stress from work and finances but doing much better on new dose of Prozac. Has taken Trazodone 25-50 mg at bedtime on occasion which has helped. Takes Klonopin about twice a week only for extreme anxiety.    Observations/Objective: Format  Patient present at home Provider present at office Consent for interaction obtained Coronavirus outbreak made virtual visit necessary  Alert, oriented. Dressed appropriately. Smiling at times. Thoughts logical, coherent and relevant.   Assessment and Plan: Problem List Items Addressed This Visit      Other   Depression with anxiety - Primary   Relevant Medications   traZODone (DESYREL) 50 MG tablet   FLUoxetine (PROZAC) 20 MG capsule     Meds ordered this encounter  Medications  . traZODone (DESYREL) 50 MG tablet    Sig: Take 0.5-1 tablets (25-50 mg total) by mouth at bedtime as needed for sleep.    Dispense:  30 tablet    Refill:  2    Order Specific Question:   Supervising Provider    Answer:   Sallee Lange A [id number]  . FLUoxetine (PROZAC) 20 MG capsule    Sig: Take 3 pills daily (60 mg  total)    Dispense:  90 capsule    Refill:  2    Order Specific Question:   Supervising Provider    Answer:   Sallee Lange A [[id number]]     Follow Up Instructions: Continue current medications as directed. Discussed stress reduction.  Return in about 3 months (around 01/11/2019) for Follow up.    I discussed the assessment and treatment plan with the patient. The patient was provided an opportunity to ask questions and all were answered. The patient agreed with the plan and demonstrated an understanding of the instructions.   The patient was advised to call back or seek an in-person evaluation if the symptoms worsen or if the condition fails to improve as anticipated.  I provided 15 minutes of non-face-to-face time during this encounter.  15 minutes was spent with the patient.  This statement verifies that 15 minutes was indeed spent with the patient.  More than 50% of this visit-total duration of the visit-was spent in counseling and coordination of care. The issues that the patient came in for today as reflected in the diagnosis (s) please refer to documentation for further details.      Review of Systems     Objective:   Physical Exam        Assessment & Plan:

## 2018-10-12 ENCOUNTER — Encounter: Payer: Self-pay | Admitting: Nurse Practitioner

## 2018-11-02 ENCOUNTER — Other Ambulatory Visit: Payer: Self-pay | Admitting: Family Medicine

## 2018-11-26 ENCOUNTER — Telehealth: Payer: Self-pay | Admitting: Family Medicine

## 2018-11-26 ENCOUNTER — Ambulatory Visit (INDEPENDENT_AMBULATORY_CARE_PROVIDER_SITE_OTHER): Payer: 59 | Admitting: Family Medicine

## 2018-11-26 ENCOUNTER — Encounter: Payer: Self-pay | Admitting: Family Medicine

## 2018-11-26 DIAGNOSIS — B9689 Other specified bacterial agents as the cause of diseases classified elsewhere: Secondary | ICD-10-CM | POA: Diagnosis not present

## 2018-11-26 DIAGNOSIS — J4521 Mild intermittent asthma with (acute) exacerbation: Secondary | ICD-10-CM

## 2018-11-26 DIAGNOSIS — J019 Acute sinusitis, unspecified: Secondary | ICD-10-CM | POA: Diagnosis not present

## 2018-11-26 MED ORDER — AMOXICILLIN-POT CLAVULANATE 875-125 MG PO TABS: ORAL_TABLET | ORAL | 0 refills | Status: DC

## 2018-11-26 MED ORDER — ALBUTEROL SULFATE HFA 108 (90 BASE) MCG/ACT IN AERS: INHALATION_SPRAY | RESPIRATORY_TRACT | 0 refills | Status: DC

## 2018-11-26 MED ORDER — PREDNISONE 20 MG PO TABS: ORAL_TABLET | ORAL | 0 refills | Status: DC

## 2018-11-26 NOTE — Progress Notes (Signed)
   Subjective:  Audio plus video  Patient ID: Megan Parker, female    DOB: 11/07/1965, 53 y.o.   MRN: [id number]  Cough This is a new problem. The current episode started in the past 7 days. Cough characteristics: dry hacking cough for about a week  Associated symptoms comments: Ear pressure in both ears, chest tightness, feels like someone is sitting on chest, sneezing, runny nose at times, temp this morning 97. She has tried nothing for the symptoms.  pt states that she has been tested for COVID and the test came back negative. Pt states she is tested at her place of work.  Virtual Visit via Video Note  I connected with Megan Parker on 11/26/18 at 11:00 AM EDT by a video enabled telemedicine application and verified that I am speaking with the correct person using two identifiers.  Location: Patient: home Provider: office   I discussed the limitations of evaluation and management by telemedicine and the availability of in person appointments. The patient expressed understanding and agreed to proceed.  History of Present Illness:    Observations/Objective:   Assessment and Plan:   Follow Up Instructions:    I discussed the assessment and treatment plan with the patient. The patient was provided an opportunity to ask questions and all were answered. The patient agreed with the plan and demonstrated an understanding of the instructions.   The patient was advised to call back or seek an in-person evaluation if the symptoms worsen or if the condition fails to improve as anticipated.  I provided 17 minutes of non-face-to-face time during this encounter.   Vicente Males, LPN  Ear pressure  Pos cong notes congestion in her sinus area.  Also in the chest.  Coughing quite a bit more.  Productive at times.  Had negative COVID-19 is noted.  Using albuterol as needed.    Review of Systems  Respiratory: Positive for cough.        Objective:   Physical Exam   Virtual      Assessment & Plan:  Sinusitis bronchitis with exacerbation reactive airways plan antibiotics prescribed symptom care discussed warning signs discussed prednisone taper given use albuterol proper use discussed questions answered

## 2018-11-26 NOTE — Telephone Encounter (Signed)
Work note faxed to [place] at [phone] with confirmation received.

## 2018-11-27 ENCOUNTER — Encounter: Payer: Self-pay | Admitting: Family Medicine

## 2018-12-04 ENCOUNTER — Telehealth: Payer: Self-pay | Admitting: Family Medicine

## 2018-12-04 MED ORDER — ALBUTEROL SULFATE HFA 108 (90 BASE) MCG/ACT IN AERS: INHALATION_SPRAY | RESPIRATORY_TRACT | 4 refills | Status: DC

## 2018-12-04 NOTE — Telephone Encounter (Signed)
Pt needs refill on her inhaler, states the one she currently uses is too expensive, she called her insurance company & was told to ask [place] to order Ventolin as it is cheaper on her formulary   Please advise & call pt     [facility]/[place]

## 2018-12-04 NOTE — Telephone Encounter (Signed)
Medication sent in and pt is aware  

## 2018-12-04 NOTE — Telephone Encounter (Signed)
Please order Ventolin specifically 2 puffs every 4 hours as needed, 1 inhaler with 4 refills

## 2018-12-04 NOTE — Telephone Encounter (Signed)
Please advise. Thank you

## 2018-12-25 ENCOUNTER — Other Ambulatory Visit: Payer: Self-pay | Admitting: Family Medicine

## 2019-01-20 ENCOUNTER — Other Ambulatory Visit: Payer: Self-pay | Admitting: Nurse Practitioner

## 2019-01-20 ENCOUNTER — Other Ambulatory Visit: Payer: Self-pay | Admitting: Family Medicine

## 2019-02-17 ENCOUNTER — Other Ambulatory Visit: Payer: Self-pay | Admitting: Nurse Practitioner

## 2019-02-18 NOTE — Telephone Encounter (Signed)
Patient may have 90-day but needs a virtual visit before further prescriptions

## 2019-03-10 ENCOUNTER — Telehealth: Payer: Self-pay | Admitting: Family Medicine

## 2019-03-10 MED ORDER — ALBUTEROL SULFATE HFA 108 (90 BASE) MCG/ACT IN AERS: 2.0000 | INHALATION_SPRAY | RESPIRATORY_TRACT | 5 refills | Status: DC | PRN

## 2019-03-10 NOTE — Telephone Encounter (Signed)
Fax from pharmacy requesting refill on Proair Inhaler 8.5 grams; inhale 2 puffs into lungs every 4 hours prn wheezing. Pt last seen 11/26/2018 for rhinosinusitis. Please advise. Thank you

## 2019-03-10 NOTE — Telephone Encounter (Signed)
Medication sent to pharmacy  

## 2019-03-10 NOTE — Addendum Note (Signed)
Addended by: Marlowe Shores on: 03/10/2019 04:39 PM   Modules accepted: Orders

## 2019-03-10 NOTE — Telephone Encounter (Signed)
6 refills °

## 2019-05-12 ENCOUNTER — Telehealth: Payer: Self-pay | Admitting: Family Medicine

## 2019-05-12 NOTE — Telephone Encounter (Signed)
Patient is requesting refill on fluoxetine 20 mg. Last filled 02/18/19. Patient last seen 09/27/2018. [employer]

## 2019-05-12 NOTE — Telephone Encounter (Signed)
Pharmacist from [place] called to confirm script -they had transferred from another pharmacy. Patient notified.

## 2019-06-02 ENCOUNTER — Ambulatory Visit (INDEPENDENT_AMBULATORY_CARE_PROVIDER_SITE_OTHER): Payer: PRIVATE HEALTH INSURANCE | Admitting: Family Medicine

## 2019-06-02 ENCOUNTER — Encounter: Payer: Self-pay | Admitting: Family Medicine

## 2019-06-02 ENCOUNTER — Other Ambulatory Visit: Payer: Self-pay

## 2019-06-02 DIAGNOSIS — M545 Low back pain, unspecified: Secondary | ICD-10-CM

## 2019-06-02 MED ORDER — PREDNISONE 20 MG PO TABS: ORAL_TABLET | ORAL | 0 refills | Status: DC

## 2019-06-02 MED ORDER — HYDROCODONE-ACETAMINOPHEN 5-325 MG PO TABS: ORAL_TABLET | ORAL | 0 refills | Status: DC

## 2019-06-02 NOTE — Progress Notes (Signed)
   Subjective:    Patient ID: Megan Parker, female    DOB: 1965/06/01, 54 y.o.   MRN: [id number]  Back Pain This is a new problem. The current episode started in the past 7 days. The problem occurs constantly. The problem has been gradually worsening since onset. The quality of the pain is described as shooting and stabbing. The pain does not radiate. The pain is at a severity of 8/10. The pain is the same all the time. The symptoms are aggravated by sitting, bending, twisting, standing and position. Pertinent negatives include no abdominal pain, chest pain or weakness. She has tried NSAIDs for the symptoms. The treatment provided no relief.  She denies sciatica symptoms does denies any weakness.  States no known injury other than bending over to pick up something triggered it Patient has had this before   Virtual Visit via Video Note  I connected with Megan Parker on 06/02/19 at  9:30 AM EDT by a video enabled telemedicine application and verified that I am speaking with the correct person using two identifiers.  Location: Patient: home Provider: office   I discussed the limitations of evaluation and management by telemedicine and the availability of in person appointments. The patient expressed understanding and agreed to proceed.  History of Present Illness:    Observations/Objective:   Assessment and Plan:   Follow Up Instructions:    I discussed the assessment and treatment plan with the patient. The patient was provided an opportunity to ask questions and all were answered. The patient agreed with the plan and demonstrated an understanding of the instructions.   The patient was advised to call back or seek an in-person evaluation if the symptoms worsen or if the condition fails to improve as anticipated.  I provided 20 minutes of non-face-to-face time during this encounter.      Review of Systems  Constitutional: Negative for activity change and appetite change.  HENT:  Negative for congestion and rhinorrhea.   Respiratory: Negative for cough and shortness of breath.   Cardiovascular: Negative for chest pain and leg swelling.  Gastrointestinal: Negative for abdominal pain, nausea and vomiting.  Musculoskeletal: Positive for back pain.  Skin: Negative for color change.  Neurological: Negative for dizziness and weakness.  Psychiatric/Behavioral: Negative for agitation and confusion.       Objective:   Physical Exam Patient had virtual visit Appears to be in no distress Atraumatic Neuro able to relate and oriented No apparent resp distress Color normal        Assessment & Plan:  Back pain Gentle range of motion exercises shown through the video Patient to do these exercises 10 to 15 minutes twice a day If not seeing significant improvement over the next few days notify us Work excuse given for this week Prednisone taper recommended Hydrocodone only as needed caution drowsiness home use only May use ibuprofen after prednisone is finished

## 2019-06-06 ENCOUNTER — Encounter: Payer: Self-pay | Admitting: Nurse Practitioner

## 2019-06-06 ENCOUNTER — Telehealth: Payer: Self-pay | Admitting: *Deleted

## 2019-06-06 ENCOUNTER — Other Ambulatory Visit: Payer: Self-pay

## 2019-06-06 ENCOUNTER — Telehealth (INDEPENDENT_AMBULATORY_CARE_PROVIDER_SITE_OTHER): Payer: PRIVATE HEALTH INSURANCE | Admitting: Nurse Practitioner

## 2019-06-06 DIAGNOSIS — G4700 Insomnia, unspecified: Secondary | ICD-10-CM

## 2019-06-06 DIAGNOSIS — F418 Other specified anxiety disorders: Secondary | ICD-10-CM | POA: Diagnosis not present

## 2019-06-06 MED ORDER — FLUOXETINE HCL 40 MG PO CAPS: ORAL_CAPSULE | ORAL | 0 refills | Status: DC

## 2019-06-06 MED ORDER — TRAZODONE HCL 50 MG PO TABS: ORAL_TABLET | ORAL | 2 refills | Status: DC

## 2019-06-06 NOTE — Progress Notes (Signed)
VIDEO VISIT Subjective:    Patient ID: Megan Parker, female    DOB: May 07, 1965, 54 y.o.   MRN: [id number]  HPI presents today virtually for medication refills and follow up on depression. She is currently on 60mg  of Prozac daily. Increased appetite and increased weight gain with stressors. Currently smoking 3/4 a pack a day. Complains of intermittent back pain with prolong sitting. Takes Hydrocodone for that PRN. Was seen recently for acute back pain which is much better.  Sleep is ok but not the best. Currently takes Trazodone PRN. Requesting refills.     Review of Systems  Constitutional: Positive for appetite change and fatigue.  Respiratory: Negative for cough, chest tightness, shortness of breath and wheezing.   Cardiovascular: Negative for chest pain and palpitations.  Gastrointestinal: Negative for constipation, diarrhea and nausea.  Musculoskeletal:       Recently injured back at work but pain is better now.   Psychiatric/Behavioral: Negative for hallucinations, self-injury and suicidal ideas. The patient is not nervous/anxious and is not hyperactive.        "Having good and bad days"   Depression screen [facility] 2/9 06/06/2019 09/27/2018  Decreased Interest 1 3  Down, Depressed, Hopeless 0 3  PHQ - 2 Score 1 6  Altered sleeping 3 3  Tired, decreased energy 3 3  Change in appetite 1 3  Feeling bad or failure about yourself  0 3  Trouble concentrating 3 3  Moving slowly or fidgety/restless 1 3  Suicidal thoughts 0 0  PHQ-9 Score 12 24  Difficult doing work/chores Somewhat difficult Extremely dIfficult          Objective:   Physical Exam Psychiatric:        Attention and Perception: Attention normal.        Mood and Affect: Mood normal.        Speech: Speech normal.        Behavior: Behavior normal.        Thought Content: Thought content normal.        Cognition and Memory: Cognition and memory normal.        Judgment: Judgment normal.    Format  Patient present at  home Provider present at office Consent for interaction obtained Coronavirus outbreak made virtual visit necessary         Assessment & Plan:   Problem List Items Addressed This Visit      Other   Depression with anxiety - Primary   Relevant Medications   FLUoxetine (PROZAC) 40 MG capsule   traZODone (DESYREL) 50 MG tablet   Insomnia   Morbid obesity (HCC)      Education: -Increase your Prozac from 60 to 80mg . So if you currently have 20mg  tablet you can take 4 tablets until you run out. Then start your new prescription. -Schedule your annual woman's wellness exam. Once this is scheduled we will need to get lab work from which ever location you choose just let 11/27/2018 know so we can send lab work information. It will need to be done fasting so no eating or drinking after midnight the night before. -Follow up with after starting the 80mg  of Prozac to let me know how you are feeling. If you experience any adverse effects please discontinue medication and call the office -Will assess for low dose CT for lung cancer screening at her wellness exam. Discussed reducing or stopping tobacco use.  -Follow up virtually in 1 month. Contact office sooner if any problems with new  Prozac dose.  Meds ordered this encounter  Medications  . FLUoxetine (PROZAC) 40 MG capsule    Sig: Take 2 capsule PO QD (80mg )    Dispense:  180 capsule    Refill:  0    Order Specific Question:   Supervising Provider    Answer:   Sallee Lange A [[id number]]  . traZODone (DESYREL) 50 MG tablet    Sig: TAKE 1/2 TO 1 TABLET(25 TO 50 MG) BY MOUTH AT BEDTIME AS NEEDED FOR SLEEP    Dispense:  30 tablet    Refill:  2    Order Specific Question:   Supervising Provider    Answer:   Sallee Lange A [[id number]]

## 2019-06-06 NOTE — Progress Notes (Signed)
   Subjective:    Patient ID: Megan Parker, female    DOB: 01/18/1966, 54 y.o.   MRN: [id number]  HPI  Patient calls for a follow up on depression. See PHQ-9. Patient states she has trouble getting motivated like she used to.  Virtual Visit via Video Note  I connected with Megan Parker on 06/06/19 at  2:00 PM EDT by a video enabled telemedicine application and verified that I am speaking with the correct person using two identifiers.  Location: Patient: home Provider: office   I discussed the limitations of evaluation and management by telemedicine and the availability of in person appointments. The patient expressed understanding and agreed to proceed.  History of Present Illness:    Observations/Objective:   Assessment and Plan:   Follow Up Instructions:    I discussed the assessment and treatment plan with the patient. The patient was provided an opportunity to ask questions and all were answered. The patient agreed with the plan and demonstrated an understanding of the instructions.   The patient was advised to call back or seek an in-person evaluation if the symptoms worsen or if the condition fails to improve as anticipated.  I provided 15 minutes of non-face-to-face time during this encounter.    Review of Systems     Objective:   Physical Exam        Assessment & Plan:

## 2019-06-06 NOTE — Telephone Encounter (Signed)
[name], damman are scheduled for a virtual visit with your provider today.    Just as we do with appointments in the office, we must obtain your consent to participate.  Your consent will be active for this visit and any virtual visit you may have with one of our providers in the next 365 days.    If you have a MyChart account, I can also send a copy of this consent to you electronically.  All virtual visits are billed to your insurance company just like a traditional visit in the office.  As this is a virtual visit, video technology does not allow for your provider to perform a traditional examination.  This may limit your provider's ability to fully assess your condition.  If your provider identifies any concerns that need to be evaluated in person or the need to arrange testing such as labs, EKG, etc, we will make arrangements to do so.    Although advances in technology are sophisticated, we cannot ensure that it will always work on either your end or our end.  If the connection with a video visit is poor, we may have to switch to a telephone visit.  With either a video or telephone visit, we are not always able to ensure that we have a secure connection.   I need to obtain your verbal consent now.   Are you willing to proceed with your visit today?   Megan Parker has provided verbal consent on 06/06/2019 for a virtual visit (video or telephone).   Kathleen Lime, RN 06/06/2019  1:19 PM

## 2019-06-09 ENCOUNTER — Telehealth: Payer: Self-pay | Admitting: Nurse Practitioner

## 2019-06-09 DIAGNOSIS — R5383 Other fatigue: Secondary | ICD-10-CM

## 2019-06-09 DIAGNOSIS — Z0000 Encounter for general adult medical examination without abnormal findings: Secondary | ICD-10-CM

## 2019-06-09 DIAGNOSIS — Z1329 Encounter for screening for other suspected endocrine disorder: Secondary | ICD-10-CM

## 2019-06-09 DIAGNOSIS — Z79899 Other long term (current) drug therapy: Secondary | ICD-10-CM

## 2019-06-09 NOTE — Telephone Encounter (Signed)
10/19 labs: Liver, Lipid, Met 7, Lipase and CBC

## 2019-06-09 NOTE — Telephone Encounter (Signed)
Sorry, dr scott's pt

## 2019-06-09 NOTE — Telephone Encounter (Signed)
Patient is requesting labs for physical in 6/19 .Please send to [facility] that what her insurance will cover.

## 2019-06-10 NOTE — Telephone Encounter (Signed)
Please order the following labs through [facility]: Met 7 Liver Lipid TSH CBC with diff B12 and magnesium (high risk med use)  Thanks!

## 2019-06-10 NOTE — Telephone Encounter (Signed)
Lab orders placed for [facility]. Left message to return call

## 2019-06-11 NOTE — Telephone Encounter (Signed)
Left message to return call and also sent MyChart message  °

## 2019-06-11 NOTE — Telephone Encounter (Signed)
Pt returned call and verbalized understanding  

## 2019-06-16 ENCOUNTER — Other Ambulatory Visit: Payer: Self-pay | Admitting: *Deleted

## 2019-06-16 ENCOUNTER — Telehealth: Payer: Self-pay | Admitting: Family Medicine

## 2019-06-16 MED ORDER — PANTOPRAZOLE SODIUM 40 MG PO TBEC: DELAYED_RELEASE_TABLET | ORAL | 0 refills | Status: DC

## 2019-06-16 NOTE — Telephone Encounter (Signed)
Pt called back and carolyn did change med on med list to 40mg  2 qd and sent it in on the 16th. I called the pharm and was told that pt picked up that script on the 16th and insurance would not cover 90 day supply so it was changed to 30 day supply and so she has 2 refills on it left. Pt states they gave her script that said 20mg  take 3 a day and she was taking 4 a day to make 80mg . I told pt to take her bottle to pharm and let them know she needs refill on the bottle that has 40mg  2 daily and pharm did confirm they have it  there and also refill on protonix sent to pharm per protocol.

## 2019-06-16 NOTE — Telephone Encounter (Signed)
Pt saw Megan Parker on 4/16.   Megan Parker changed the dose of her FLUoxetine (PROZAC) 40 MG capsule to 80 MG.   She is currently out of the medication due to the dose change and needs a new script sent in.   Also needing a refill on pantoprazole (PROTONIX) 40 MG tablet  [id number] - [place], [place] - [address]

## 2019-06-16 NOTE — Telephone Encounter (Signed)
Left message to return call with pt to get more information because I do not see any note about change in the note and carolyn out of office

## 2019-06-21 LAB — CBC WITH DIFFERENTIAL/PLATELET
Absolute Monocytes: 629 cells/uL (ref 200–950)
Basophils Absolute: 37 cells/uL (ref 0–200)
Basophils Relative: 0.5 %
Eosinophils Absolute: 178 cells/uL (ref 15–500)
Eosinophils Relative: 2.4 %
HCT: 41.1 % (ref 35.0–45.0)
Hemoglobin: 13.5 g/dL (ref 11.7–15.5)
Lymphs Abs: 1776 cells/uL (ref 850–3900)
MCH: 26.9 pg — ABNORMAL LOW (ref 27.0–33.0)
MCHC: 32.8 g/dL (ref 32.0–36.0)
MCV: 81.9 fL (ref 80.0–100.0)
MPV: 11.5 fL (ref 7.5–12.5)
Monocytes Relative: 8.5 %
Neutro Abs: 4780 cells/uL (ref 1500–7800)
Neutrophils Relative %: 64.6 %
Platelets: 212 10*3/uL (ref 140–400)
RBC: 5.02 10*6/uL (ref 3.80–5.10)
RDW: 13.5 % (ref 11.0–15.0)
Total Lymphocyte: 24 %
WBC: 7.4 10*3/uL (ref 3.8–10.8)

## 2019-06-21 LAB — BASIC METABOLIC PANEL
BUN: 18 mg/dL (ref 7–25)
CO2: 28 mmol/L (ref 20–32)
Calcium: 9.6 mg/dL (ref 8.6–10.4)
Chloride: 106 mmol/L (ref 98–110)
Creat: 0.73 mg/dL (ref 0.50–1.05)
Glucose, Bld: 96 mg/dL (ref 65–99)
Potassium: 4.9 mmol/L (ref 3.5–5.3)
Sodium: 142 mmol/L (ref 135–146)

## 2019-06-21 LAB — HEPATIC FUNCTION PANEL
AG Ratio: 2.3 (calc) (ref 1.0–2.5)
ALT: 22 U/L (ref 6–29)
AST: 15 U/L (ref 10–35)
Albumin: 4.3 g/dL (ref 3.6–5.1)
Alkaline phosphatase (APISO): 91 U/L (ref 37–153)
Bilirubin, Direct: 0.1 mg/dL (ref 0.0–0.2)
Globulin: 1.9 g/dL (calc) (ref 1.9–3.7)
Indirect Bilirubin: 0.3 mg/dL (calc) (ref 0.2–1.2)
Total Bilirubin: 0.4 mg/dL (ref 0.2–1.2)
Total Protein: 6.2 g/dL (ref 6.1–8.1)

## 2019-06-21 LAB — LIPID PANEL
Cholesterol: 169 mg/dL (ref <200)
HDL: 68 mg/dL (ref >=50)
LDL Cholesterol (Calc): 77 mg/dL (calc)
Non-HDL Cholesterol (Calc): 101 mg/dL (calc) (ref <130)
Total CHOL/HDL Ratio: 2.5 (calc) (ref <5.0)
Triglycerides: 143 mg/dL (ref <150)

## 2019-06-21 LAB — TSH: TSH: 0.99 mIU/L

## 2019-06-21 LAB — VITAMIN B12: Vitamin B-12: 377 pg/mL (ref 200–1100)

## 2019-06-21 LAB — MAGNESIUM: Magnesium: 2.1 mg/dL (ref 1.5–2.5)

## 2019-07-09 ENCOUNTER — Encounter: Payer: Self-pay | Admitting: Nurse Practitioner

## 2019-07-09 ENCOUNTER — Other Ambulatory Visit: Payer: Self-pay

## 2019-07-09 ENCOUNTER — Ambulatory Visit (INDEPENDENT_AMBULATORY_CARE_PROVIDER_SITE_OTHER): Payer: PRIVATE HEALTH INSURANCE | Admitting: Nurse Practitioner

## 2019-07-09 ENCOUNTER — Other Ambulatory Visit (HOSPITAL_COMMUNITY): Payer: Self-pay | Admitting: Nurse Practitioner

## 2019-07-09 VITALS — BP 140/82 | Temp 95.8°F | Ht 63.0 in | Wt 262.2 lb

## 2019-07-09 DIAGNOSIS — Z124 Encounter for screening for malignant neoplasm of cervix: Secondary | ICD-10-CM | POA: Diagnosis not present

## 2019-07-09 DIAGNOSIS — R079 Chest pain, unspecified: Secondary | ICD-10-CM

## 2019-07-09 DIAGNOSIS — Z1231 Encounter for screening mammogram for malignant neoplasm of breast: Secondary | ICD-10-CM

## 2019-07-09 DIAGNOSIS — Z1151 Encounter for screening for human papillomavirus (HPV): Secondary | ICD-10-CM

## 2019-07-09 DIAGNOSIS — Z01419 Encounter for gynecological examination (general) (routine) without abnormal findings: Secondary | ICD-10-CM | POA: Diagnosis not present

## 2019-07-09 DIAGNOSIS — Z122 Encounter for screening for malignant neoplasm of respiratory organs: Secondary | ICD-10-CM

## 2019-07-09 DIAGNOSIS — Z23 Encounter for immunization: Secondary | ICD-10-CM | POA: Diagnosis not present

## 2019-07-09 MED ORDER — FLOVENT HFA 110 MCG/ACT IN AERO: 2.0000 | INHALATION_SPRAY | Freq: Two times a day (BID) | RESPIRATORY_TRACT | 12 refills | Status: DC

## 2019-07-09 NOTE — Progress Notes (Signed)
   Subjective:    Patient ID: Megan Parker, female    DOB: 1966/02/09, 54 y.o.   MRN: [id number]  HPI The patient comes in today for a wellness visit.    A review of their health history was completed.  A review of medications was also completed.  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   Additional concerns: none   Review of Systems     Objective:   Physical Exam        Assessment & Plan:

## 2019-07-09 NOTE — Patient Instructions (Signed)
Please check with your insurance about an Inhaled corticosteroid inhaler for your asthma and call the office back with the preferred formulary and we will send that to the pharmacy for you.  We discussed many things today including:  Low dose CT scan for cancer screening  Mammogram screening for breast cancer  Colonoscopy screening for colon cancer  Decreasing smoking: Plan your cigarettes out in the pack and pace yourself, set a goal to decrease 1 cigarette a week if possible. You are doing great at work, just try and decrease in the evenings.
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
## 2019-07-10 ENCOUNTER — Encounter: Payer: Self-pay | Admitting: Family Medicine

## 2019-07-12 LAB — IGP, APTIMA HPV: HPV Aptima: NEGATIVE

## 2019-07-17 ENCOUNTER — Ambulatory Visit (HOSPITAL_COMMUNITY)
Admission: RE | Admit: 2019-07-17 | Discharge: 2019-07-17 | Disposition: A | Discharge status: Home / Self Care | Payer: PRIVATE HEALTH INSURANCE | Source: Ambulatory Visit | Attending: Nurse Practitioner | Admitting: Nurse Practitioner

## 2019-07-17 ENCOUNTER — Other Ambulatory Visit: Payer: Self-pay

## 2019-07-17 DIAGNOSIS — Z1231 Encounter for screening mammogram for malignant neoplasm of breast: Secondary | ICD-10-CM | POA: Insufficient documentation

## 2019-08-05 ENCOUNTER — Other Ambulatory Visit: Payer: Self-pay

## 2019-08-05 ENCOUNTER — Encounter: Payer: Self-pay | Admitting: Family Medicine

## 2019-08-05 ENCOUNTER — Ambulatory Visit: Payer: PRIVATE HEALTH INSURANCE | Admitting: Family Medicine

## 2019-08-05 VITALS — BP 130/80 | Temp 98.5°F | Ht 63.0 in | Wt 264.4 lb

## 2019-08-05 DIAGNOSIS — R590 Localized enlarged lymph nodes: Secondary | ICD-10-CM | POA: Diagnosis not present

## 2019-08-05 DIAGNOSIS — L03211 Cellulitis of face: Secondary | ICD-10-CM | POA: Diagnosis not present

## 2019-08-05 MED ORDER — CEPHALEXIN 500 MG PO CAPS: 500.0000 mg | ORAL_CAPSULE | Freq: Four times a day (QID) | ORAL | 0 refills | Status: DC

## 2019-08-05 NOTE — Progress Notes (Signed)
   Subjective:    Patient ID: Megan Parker, female    DOB: [birth year], 54 y.o.   MRN: [id number]  HPI  Patient arrives with a knot on right side of jaw that she noticed on Sunday. Patient is also having ear pain and pressure.  Patient relates a lot of soreness on the side of her jawline.  Denies any tooth pain currently.  Denies sinus pain.  Does smoke she knows she needs to quit we talked about ways to quit Review of Systems     Objective:   Physical Exam  Neck no adenopathy right jawline some tenderness swollen lymph node overall I do not see any sign of any type of cancers within the mouth eardrums are normal lungs clear heart regular      Assessment & Plan:  Jawline soft tissue infection with lymph node Antibiotic prescribed follow-up if progressive troubles Recommend recheck in 3 weeks time to ensure that this area has completely gone away patient voices understanding

## 2019-09-04 ENCOUNTER — Ambulatory Visit: Payer: PRIVATE HEALTH INSURANCE | Admitting: Family Medicine

## 2019-09-16 ENCOUNTER — Other Ambulatory Visit: Payer: Self-pay | Admitting: *Deleted

## 2019-09-16 ENCOUNTER — Telehealth: Payer: Self-pay | Admitting: Family Medicine

## 2019-09-16 MED ORDER — ALBUTEROL SULFATE HFA 108 (90 BASE) MCG/ACT IN AERS: INHALATION_SPRAY | RESPIRATORY_TRACT | 0 refills | Status: DC

## 2019-09-16 NOTE — Telephone Encounter (Signed)
Pt is needing refill on emergency inhaler.   [facility]

## 2019-10-19 ENCOUNTER — Other Ambulatory Visit: Payer: Self-pay | Admitting: Nurse Practitioner

## 2019-10-20 ENCOUNTER — Telehealth: Payer: Self-pay | Admitting: Family Medicine

## 2019-10-20 MED ORDER — PANTOPRAZOLE SODIUM 40 MG PO TBEC: DELAYED_RELEASE_TABLET | ORAL | 0 refills | Status: DC

## 2019-10-20 MED ORDER — FLUOXETINE HCL 40 MG PO CAPS: ORAL_CAPSULE | ORAL | 0 refills | Status: DC

## 2019-10-20 NOTE — Telephone Encounter (Signed)
Medication sent to pharmacy and pt is aware 

## 2019-10-20 NOTE — Telephone Encounter (Signed)
May have 3 months worth needs follow-up by end of the year

## 2019-10-20 NOTE — Telephone Encounter (Signed)
Pt is needing refill on FLUoxetine (PROZAC) 40 MG capsule and heart burn medicine (didn't know the name)   [facility] - [place], [place] - [address]

## 2019-12-05 ENCOUNTER — Other Ambulatory Visit: Payer: Self-pay | Admitting: *Deleted

## 2019-12-05 ENCOUNTER — Encounter: Payer: Self-pay | Admitting: Family Medicine

## 2019-12-05 ENCOUNTER — Ambulatory Visit (INDEPENDENT_AMBULATORY_CARE_PROVIDER_SITE_OTHER): Payer: PRIVATE HEALTH INSURANCE | Admitting: Family Medicine

## 2019-12-05 ENCOUNTER — Other Ambulatory Visit: Payer: Self-pay

## 2019-12-05 VITALS — HR 78 | Temp 99.6°F | Resp 16

## 2019-12-05 DIAGNOSIS — J029 Acute pharyngitis, unspecified: Secondary | ICD-10-CM

## 2019-12-05 DIAGNOSIS — H6690 Otitis media, unspecified, unspecified ear: Secondary | ICD-10-CM | POA: Insufficient documentation

## 2019-12-05 DIAGNOSIS — R059 Cough, unspecified: Secondary | ICD-10-CM | POA: Diagnosis not present

## 2019-12-05 DIAGNOSIS — H65191 Other acute nonsuppurative otitis media, right ear: Secondary | ICD-10-CM

## 2019-12-05 DIAGNOSIS — J069 Acute upper respiratory infection, unspecified: Secondary | ICD-10-CM | POA: Insufficient documentation

## 2019-12-05 MED ORDER — AMOXICILLIN-POT CLAVULANATE 875-125 MG PO TABS: 1.0000 | ORAL_TABLET | Freq: Two times a day (BID) | ORAL | 0 refills | Status: DC

## 2019-12-05 MED ORDER — BENZONATATE 100 MG PO CAPS: 100.0000 mg | ORAL_CAPSULE | Freq: Two times a day (BID) | ORAL | 0 refills | Status: DC | PRN

## 2019-12-05 NOTE — Progress Notes (Signed)
Patient ID: Megan Parker, female    DOB: [birth year], 54 y.o.   MRN: [id number]   Chief Complaint  Patient presents with  . Sinusitis   Subjective:  CC: sore throat, congestion, cough, headache  Megan Parker presents today with sore throat, nasal congestion, sneezing, eye drainage, cough, headache,.  The symptoms have been present for 5 days.  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  All her Covid test have been negative.  Pertinent negatives include no fever no chest pain no shortness of breath, she does have asthma in which she has been using her inhalers for.  She also reports that she has some pressure around her eyes.  She has tried Tylenol  sinus for 1 week and Vicks sore throat drops with minimal relief. sore throat, nasal congestion, sneezing, eye drainage. Started 5 days ago. Tried sore throat drops and tylenol sinus. Had covid test yesterday and pt states it was negative. 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    Medical History Anahlia has a past medical history of Asthma.   Outpatient Encounter Medications as of 12/05/2019  Medication Sig  . albuterol (PROVENTIL) (2.5 MG/3ML) 0.083% nebulizer solution Use via neb q 4 hrs prn wheezing  . albuterol (VENTOLIN HFA) 108 (90 Base) MCG/ACT inhaler Inhale 2 puffs every 4 hrs prn  . clonazePAM (KLONOPIN) 0.5 MG tablet Take 1/2-1 tab po BID prn anxiety  . FLUoxetine (PROZAC) 40 MG capsule Take 2 capsule PO QD (80mg )  . fluticasone (FLOVENT HFA) 110 MCG/ACT inhaler Inhale 2 puffs into the lungs 2 (two) times daily. To prevent wheezing  . naproxen sodium (ANAPROX) 550 MG tablet TAKE 1 TABLET(550 MG) BY MOUTH TWICE DAILY WITH A MEAL  . pantoprazole (PROTONIX) 40 MG tablet TAKE 1 TABLET(40 MG) BY MOUTH DAILY  . traZODone (DESYREL) 50 MG tablet TAKE 1/2 TO 1 TABLET(25 TO 50 MG) BY MOUTH AT BEDTIME AS NEEDED FOR SLEEP  . amoxicillin-clavulanate (AUGMENTIN) 875-125 MG tablet Take 1 tablet by mouth 2  (two) times daily.  . benzonatate (TESSALON) 100 MG capsule Take 1 capsule (100 mg total) by mouth 2 (two) times daily as needed for cough.  . [DISCONTINUED] cephALEXin (KEFLEX) 500 MG capsule Take 1 capsule (500 mg total) by mouth 4 (four) times daily.   No facility-administered encounter medications on file as of 12/05/2019.     Review of Systems  Constitutional: Positive for fatigue. Negative for chills and fever.  HENT: Positive for congestion, sinus pressure, sinus pain, sneezing and sore throat. Negative for trouble swallowing and voice change.   Eyes: Positive for discharge. Negative for photophobia and redness.  Respiratory: Positive for cough. Negative for shortness of breath.   Cardiovascular: Negative for chest pain.  Gastrointestinal: Negative.   Hematological: Positive for adenopathy.     Vitals Pulse 78   Temp 99.6 F (37.6 C)   Resp 16   SpO2 95%   Objective:   Physical Exam Constitutional:      General: She is not in acute distress.    Appearance: Normal appearance. She is ill-appearing. She is not toxic-appearing.  HENT:     Right Ear: Tympanic membrane is erythematous and bulging.     Left Ear: Tympanic membrane normal.     Nose: Congestion and rhinorrhea present.     Mouth/Throat:     Mouth: Mucous membranes are moist.     Pharynx: Posterior oropharyngeal erythema present. No oropharyngeal exudate.  Cardiovascular:     Rate and Rhythm: Regular rhythm.     Heart sounds: Normal heart sounds.  Pulmonary:     Effort: Pulmonary effort is normal.     Breath sounds: Normal breath sounds.  Neurological:     Mental Status: She is alert and oriented to person, place, and time.      Assessment and Plan   1. Other non-recurrent acute nonsuppurative otitis media of right ear - amoxicillin-clavulanate (AUGMENTIN) 875-125 MG tablet; Take 1 tablet by mouth 2 (two) times daily.  Dispense: 20 tablet; Refill: 0  2. Sore throat - Culture, Group A Strep  3.  Cough - benzonatate (TESSALON) 100 MG capsule; Take 1 capsule (100 mg total) by mouth 2 (two) times daily as needed for cough.  Dispense: 20 capsule; Refill: 0   Due to her sore throat,  rapid strep done in the office today, which was negative.  We will send this for culture.  Her right TM was erythematous and bulging, therefore, I will treat for otitis media.  She describes ear fullness, no pain, no drainage.  Her cough is bothersome, and I sent in [facility] for symptom relief.  I did not see any redness or discharge from her eyes today.   I recommend supportive therapy to help you to feel better.  1) Get lots of rest.  2) Take over the counter pain medication if needed, such as acetaminophen or ibuprofen. Read and follow instructions on the label and make sure not to combine other medications that may have same ingredients in it. It is important to not take too much of these ingredients.  3) Drink plenty of caffeine-free fluids. (If you have heart or kidney problems, follow the instructions of your specialist regarding amounts).  4) If you are hungry, eat a bland diet, such as the BRAT diet (bananas, rice, applesauce, toast).  5) Let us know if you are not feeling better in a week.   Agrees with plan of care discussed today. Understands warning signs to seek further care: High fever, chest pain, shortness of breath. Understands to follow-up if her symptoms do not improve.  Dorena Bodo, FNP-C

## 2019-12-08 ENCOUNTER — Encounter: Payer: Self-pay | Admitting: Family Medicine

## 2019-12-08 ENCOUNTER — Telehealth: Payer: Self-pay

## 2019-12-08 LAB — CULTURE, GROUP A STREP: Strep A Culture: NEGATIVE

## 2019-12-08 LAB — SPECIMEN STATUS REPORT

## 2019-12-08 NOTE — Telephone Encounter (Signed)
May have work excuse through next Sunday the 24th return to work on the 25th Please touch base with patient if she is having severe shortness of breath or other worrisome issues please let me know otherwise I recommend making sure patient gets adequate rest along with her medication and if not seeing significant improvement by Thursday get rechecked sooner if problems

## 2019-12-08 NOTE — Telephone Encounter (Signed)
Pt saw karen last week.

## 2019-12-08 NOTE — Telephone Encounter (Signed)
Patient feeling worse today.  Still having all the same symptoms as Friday. Coughing really bad, congestion, h/a, etc.  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  Would like a work note for the week.

## 2019-12-08 NOTE — Telephone Encounter (Signed)
Please see Dr. Lorin Picket message and send work excuse through [place].

## 2020-01-09 ENCOUNTER — Other Ambulatory Visit: Payer: Self-pay | Admitting: Family Medicine

## 2020-01-09 NOTE — Telephone Encounter (Signed)
07/09/19 was last visit

## 2020-01-11 NOTE — Telephone Encounter (Signed)
May have 30 days with 1 refill needs office visit either with myself or with Clydie Braun

## 2020-02-02 ENCOUNTER — Other Ambulatory Visit: Payer: Self-pay | Admitting: Family Medicine

## 2020-02-05 ENCOUNTER — Encounter: Payer: Self-pay | Admitting: Family Medicine

## 2020-02-05 ENCOUNTER — Other Ambulatory Visit: Payer: Self-pay

## 2020-02-05 ENCOUNTER — Ambulatory Visit (INDEPENDENT_AMBULATORY_CARE_PROVIDER_SITE_OTHER): Payer: PRIVATE HEALTH INSURANCE | Admitting: Family Medicine

## 2020-02-05 VITALS — HR 87 | Temp 98.4°F | Resp 16

## 2020-02-05 DIAGNOSIS — H66001 Acute suppurative otitis media without spontaneous rupture of ear drum, right ear: Secondary | ICD-10-CM | POA: Diagnosis not present

## 2020-02-05 DIAGNOSIS — R059 Cough, unspecified: Secondary | ICD-10-CM

## 2020-02-05 MED ORDER — BENZONATATE 100 MG PO CAPS: 100.0000 mg | ORAL_CAPSULE | Freq: Two times a day (BID) | ORAL | 0 refills | Status: DC | PRN

## 2020-02-05 MED ORDER — AMOXICILLIN-POT CLAVULANATE 875-125 MG PO TABS: 1.0000 | ORAL_TABLET | Freq: Two times a day (BID) | ORAL | 0 refills | Status: DC

## 2020-02-05 NOTE — Progress Notes (Signed)
Patient ID: Megan Parker, female    DOB: 13-Jun-1965, 54 y.o.   MRN: [id number]   Chief Complaint  Patient presents with  . Cough   Subjective:  CC: ear pain and cough  This is a new problem.  Presents today for an acute visit with a complaint of cough, chills, headache, congestion, ear pain, and sinus pain and pressure, sneezing, and wheezing.  Has a history of asthma, reports that she has not needed to use her inhaler due to the fact that she has quit smoking and she reports that she is feels chest tightness.  Symptoms have been present for 4 days, she also reports blisters in her mouth, and understands to use Maalox and Benadryl mixed together together to soothe the ulcers.  She feels like this is an ear infection.  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  Will not test again today.   Patient presents today with respiratory illness Number of days present- 4 days  Symptoms include-headache, ear pressure, wheezing, congestion  Presence of worrisome signs (severe shortness of breath, lethargy, etc.) - wheezing  Recent/current visit to urgent care or ER-none  Recent direct exposure to Covid-none  Any current Covid testing- rapid covid test negative yesterday   Medical History Paddy has a past medical history of Asthma.   Outpatient Encounter Medications as of 02/05/2020  Medication Sig  . albuterol (PROVENTIL) (2.5 MG/3ML) 0.083% nebulizer solution Use via neb q 4 hrs prn wheezing  . albuterol (VENTOLIN HFA) 108 (90 Base) MCG/ACT inhaler Inhale 2 puffs every 4 hrs prn  . clonazePAM (KLONOPIN) 0.5 MG tablet Take 1/2-1 tab po BID prn anxiety  . FLUoxetine (PROZAC) 40 MG capsule TAKE 2 CAPSULES BY MOUTH ONCE DAILY. NEEDS FOLLOW UP BY THE END OF THE YEAR  . fluticasone (FLOVENT HFA) 110 MCG/ACT inhaler Inhale 2 puffs into the lungs 2 (two) times daily. To prevent wheezing  . naproxen sodium (ANAPROX) 550 MG  tablet TAKE 1 TABLET(550 MG) BY MOUTH TWICE DAILY WITH A MEAL  . pantoprazole (PROTONIX) 40 MG tablet Take 1 tablet by mouth once daily  . traZODone (DESYREL) 50 MG tablet TAKE 1/2 TO 1 TABLET(25 TO 50 MG) BY MOUTH AT BEDTIME AS NEEDED FOR SLEEP  . amoxicillin-clavulanate (AUGMENTIN) 875-125 MG tablet Take 1 tablet by mouth 2 (two) times daily.  . benzonatate (TESSALON) 100 MG capsule Take 1 capsule (100 mg total) by mouth 2 (two) times daily as needed for cough.  . [DISCONTINUED] amoxicillin-clavulanate (AUGMENTIN) 875-125 MG tablet Take 1 tablet by mouth 2 (two) times daily.  . [DISCONTINUED] benzonatate (TESSALON) 100 MG capsule Take 1 capsule (100 mg total) by mouth 2 (two) times daily as needed for cough.   No facility-administered encounter medications on file as of 02/05/2020.     Review of Systems  Constitutional: Positive for chills. Negative for fever.  HENT: Positive for congestion, ear pain, sinus pressure, sinus pain and sneezing. Negative for sore throat.   Respiratory: Positive for cough. Negative for shortness of breath.      Vitals Pulse 87   Temp 98.4 F (36.9 C)   Resp 16   SpO2 98%   Objective:   Physical Exam Vitals reviewed.  Constitutional:      Appearance: Normal appearance.  HENT:     Right Ear: Tympanic membrane is erythematous.     Left Ear: Tympanic membrane normal.  Nose: Congestion present.     Right Turbinates: Swollen.     Left Turbinates: Swollen.     Right Sinus: Maxillary sinus tenderness and frontal sinus tenderness present.     Left Sinus: Maxillary sinus tenderness and frontal sinus tenderness present.     Mouth/Throat:     Pharynx: Uvula midline. Posterior oropharyngeal erythema present. No oropharyngeal exudate.     Tonsils: No tonsillar exudate. 0 on the right. 0 on the left.  Cardiovascular:     Rate and Rhythm: Normal rate and regular rhythm.     Heart sounds: Normal heart sounds.  Pulmonary:     Effort: Pulmonary effort is  normal.     Breath sounds: Normal breath sounds.  Skin:    General: Skin is warm and dry.  Neurological:     General: No focal deficit present.     Mental Status: She is alert.  Psychiatric:        Behavior: Behavior normal.      Assessment and Plan   1. Non-recurrent acute suppurative otitis media of right ear without spontaneous rupture of tympanic membrane - amoxicillin-clavulanate (AUGMENTIN) 875-125 MG tablet; Take 1 tablet by mouth 2 (two) times daily.  Dispense: 20 tablet; Refill: 0  2. Cough in adult patient - benzonatate (TESSALON) 100 MG capsule; Take 1 capsule (100 mg total) by mouth 2 (two) times daily as needed for cough.  Dispense: 20 capsule; Refill: 0   Will treat for otitis media of the right ear, she may also have some sinusitis going on as well.  She wanted something stronger for her cough this will be sent to the pharmacy as well.  Recommend supportive therapy for all other symptoms.  Agrees with plan of care discussed today. Understands warning signs to seek further care: Chest pain, shortness of breath, any significant change in health. Understands to follow-up if symptoms do not improve, or worsen.   Dorena Bodo, FNP-C   02/05/2020

## 2020-03-15 ENCOUNTER — Other Ambulatory Visit: Payer: Self-pay

## 2020-03-15 ENCOUNTER — Ambulatory Visit: Payer: PRIVATE HEALTH INSURANCE | Admitting: Family Medicine

## 2020-03-15 DIAGNOSIS — J019 Acute sinusitis, unspecified: Secondary | ICD-10-CM | POA: Diagnosis not present

## 2020-03-15 DIAGNOSIS — U071 COVID-19: Secondary | ICD-10-CM

## 2020-03-15 MED ORDER — FLUOXETINE HCL 40 MG PO CAPS: ORAL_CAPSULE | ORAL | 2 refills | Status: DC

## 2020-03-15 MED ORDER — ALBUTEROL SULFATE HFA 108 (90 BASE) MCG/ACT IN AERS
INHALATION_SPRAY | RESPIRATORY_TRACT | 3 refills | Status: DC
Start: 2020-03-15 — End: 2021-11-11

## 2020-03-15 MED ORDER — AZITHROMYCIN 250 MG PO TABS: ORAL_TABLET | ORAL | 0 refills | Status: DC

## 2020-03-15 MED ORDER — PANTOPRAZOLE SODIUM 40 MG PO TBEC: DELAYED_RELEASE_TABLET | ORAL | 2 refills | Status: DC

## 2020-03-15 MED ORDER — PREDNISONE 20 MG PO TABS: ORAL_TABLET | ORAL | 0 refills | Status: DC

## 2020-03-15 NOTE — Progress Notes (Signed)
   Subjective:    Patient ID: Megan Parker, female    DOB: May 29, 1965, 55 y.o.   MRN: [id number]  Cough This is a new problem. The current episode started 1 to 4 weeks ago. Associated symptoms include nasal congestion and wheezing.   Patient with head congestion drainage coughing wheezing present over the past week and a half had a positive Covid test 1 week ago Relates fatigue tiredness feeling rundown  Review of Systems  Respiratory: Positive for cough and wheezing.   Denies nausea vomiting diarrhea.     Objective:   Physical Exam  Scattered wheezes no rales no respiratory distress O2 saturation 97% after walking to her vehicle it was 98% HEENT benign     Assessment & Plan:  Positive Covid Rest recommended Go ahead with treatment with antibiotics and prednisone albuterol Follow-up if progressive troubles or worse Stay out of work the rest of this week If not able to go back to work next week to notify us we may have to keep her out of additional week Warning signs were discussed in detail

## 2020-03-16 ENCOUNTER — Ambulatory Visit: Payer: PRIVATE HEALTH INSURANCE | Admitting: Family Medicine

## 2020-03-17 ENCOUNTER — Telehealth: Payer: Self-pay | Admitting: Family Medicine

## 2020-03-17 ENCOUNTER — Encounter: Payer: Self-pay | Admitting: Family Medicine

## 2020-03-17 NOTE — Telephone Encounter (Signed)
FYI - Pt declined appt. States she just feels tired and has no energy. No fever, eating and drinking. No more sob than she had when she was seen. Inhaler is helping. Would like a work note returning 1/31 and I will send front a message to get her the note

## 2020-03-17 NOTE — Telephone Encounter (Signed)
Please give note. See message below

## 2020-03-17 NOTE — Telephone Encounter (Signed)
1.  May extend work note for an additional week  Obviously if she is having severe difficulty breathing and getting a lot worse she needs to be seen ASAP otherwise #2 if she would like to be rechecked either Thursday afternoon or Friday afternoon with me we can do that Or if she would just like to watch things closely that is okay as well

## 2020-03-17 NOTE — Telephone Encounter (Signed)
Patient states still not feeling any better having cough, fatigue and wanting to extend her work not . Her note was from 1/24-1/30 returning to work on 1/31. Please advise

## 2020-04-06 ENCOUNTER — Telehealth: Payer: Self-pay

## 2020-04-06 DIAGNOSIS — Z79899 Other long term (current) drug therapy: Secondary | ICD-10-CM

## 2020-04-06 DIAGNOSIS — Z13220 Encounter for screening for lipoid disorders: Secondary | ICD-10-CM

## 2020-04-06 NOTE — Telephone Encounter (Signed)
Pt made Med Check appt on April 6 will she need blood work?   Pt call back [phone]

## 2020-04-06 NOTE — Telephone Encounter (Signed)
Last labs completed 06/20/19-magnesium, b12, cbc, tsh, lipid, hepatic and bmet. Please advise. Thank you

## 2020-04-07 NOTE — Telephone Encounter (Signed)
I would recommend lipid, metabolic 7, screening

## 2020-04-08 NOTE — Telephone Encounter (Signed)
Lab orders placed; left message to return call  

## 2020-04-08 NOTE — Telephone Encounter (Signed)
Informed patient

## 2020-04-13 ENCOUNTER — Telehealth: Payer: Self-pay

## 2020-04-13 MED ORDER — NAPROXEN SODIUM 550 MG PO TABS: ORAL_TABLET | ORAL | 2 refills | Status: DC

## 2020-04-13 NOTE — Telephone Encounter (Signed)
This month and 2 rf

## 2020-04-13 NOTE — Telephone Encounter (Signed)
Prescription sent electronically to pharmacy. Patient notified. 

## 2020-04-13 NOTE — Telephone Encounter (Signed)
Pt needs refill on naproxen sodium (ANAPROX) 550 MG tablet  Sent to [facility] in [place]   Pt call back [phone]

## 2020-04-30 ENCOUNTER — Other Ambulatory Visit: Payer: Self-pay | Admitting: Family Medicine

## 2020-05-01 LAB — LIPID PANEL
Cholesterol: 169 mg/dL (ref <200)
HDL: 70 mg/dL (ref >=50)
LDL Cholesterol (Calc): 79 mg/dL (calc)
Non-HDL Cholesterol (Calc): 99 mg/dL (calc) (ref <130)
Total CHOL/HDL Ratio: 2.4 (calc) (ref <5.0)
Triglycerides: 113 mg/dL (ref <150)

## 2020-05-01 LAB — BASIC METABOLIC PANEL WITH GFR
BUN: 17 mg/dL (ref 7–25)
CO2: 29 mmol/L (ref 20–32)
Calcium: 9.4 mg/dL (ref 8.6–10.4)
Chloride: 105 mmol/L (ref 98–110)
Creat: 0.68 mg/dL (ref 0.50–1.05)
GFR, Est African American: 115 mL/min/{1.73_m2} (ref >=60)
GFR, Est Non African American: 99 mL/min/{1.73_m2} (ref >=60)
Glucose, Bld: 93 mg/dL (ref 65–99)
Potassium: 4.8 mmol/L (ref 3.5–5.3)
Sodium: 141 mmol/L (ref 135–146)

## 2020-05-26 ENCOUNTER — Other Ambulatory Visit: Payer: Self-pay

## 2020-05-26 ENCOUNTER — Encounter: Payer: Self-pay | Admitting: Family Medicine

## 2020-05-26 ENCOUNTER — Ambulatory Visit: Payer: PRIVATE HEALTH INSURANCE | Admitting: Family Medicine

## 2020-05-26 VITALS — BP 119/79 | HR 77 | Temp 96.6°F | Wt 269.6 lb

## 2020-05-26 DIAGNOSIS — F411 Generalized anxiety disorder: Secondary | ICD-10-CM | POA: Insufficient documentation

## 2020-05-26 DIAGNOSIS — G542 Cervical root disorders, not elsewhere classified: Secondary | ICD-10-CM | POA: Diagnosis not present

## 2020-05-26 DIAGNOSIS — M79604 Pain in right leg: Secondary | ICD-10-CM

## 2020-05-26 DIAGNOSIS — M79605 Pain in left leg: Secondary | ICD-10-CM

## 2020-05-26 MED ORDER — FLUOXETINE HCL 40 MG PO CAPS: ORAL_CAPSULE | ORAL | 5 refills | Status: DC

## 2020-05-26 MED ORDER — FLOVENT HFA 110 MCG/ACT IN AERO: 2.0000 | INHALATION_SPRAY | Freq: Two times a day (BID) | RESPIRATORY_TRACT | 5 refills | Status: DC

## 2020-05-26 MED ORDER — PANTOPRAZOLE SODIUM 40 MG PO TBEC: DELAYED_RELEASE_TABLET | ORAL | 5 refills | Status: DC

## 2020-05-26 MED ORDER — CLONAZEPAM 0.5 MG PO TABS: ORAL_TABLET | ORAL | 1 refills | Status: DC

## 2020-05-26 MED ORDER — DICLOFENAC SODIUM 75 MG PO TBEC: DELAYED_RELEASE_TABLET | ORAL | 2 refills | Status: DC

## 2020-05-26 NOTE — Progress Notes (Signed)
Subjective:    Patient ID: Megan Parker, female    DOB: 01-Dec-1965, 55 y.o.   MRN: [id number]  HPI Pt here for med check. Pt states she has had neck pain on her left side for about one week. Unable to sleep due to pain.  Pain radiates in trapezius on the left side.  Does not radiate down the arm.  Denies any type of chest tightness pressure pain shortness of breath.  Bilateral leg pain; when walking it feels as if a bee has stung her. Leg will burn; in different areas. Will cramp at night.  Denies severe back pain.  States more so shooting sharp pains in the legs sometimes left sometimes right and at times causes muscle cramps.  Denies numbness or tingling.  Results for orders placed or performed in visit on 04/30/20  Lipid panel  Result Value Ref Range   Cholesterol 169 <200 mg/dL   HDL 70 > OR = 50 mg/dL   Triglycerides 409 <811 mg/dL   LDL Cholesterol (Calc) 79 mg/dL (calc)   Total CHOL/HDL Ratio 2.4 <5.0 (calc)   Non-HDL Cholesterol (Calc) 99 <914 mg/dL (calc)  BASIC METABOLIC PANEL WITH GFR  Result Value Ref Range   Glucose, Bld 93 65 - 99 mg/dL   BUN 17 7 - 25 mg/dL   Creat 7.82 9.56 - 2.13 mg/dL   GFR, Est Non African American 99 > OR = 60 mL/min/1.58m2   GFR, Est African American 115 > OR = 60 mL/min/1.50m2   BUN/Creatinine Ratio NOT APPLICABLE 6 - 22 (calc)   Sodium 141 135 - 146 mmol/L   Potassium 4.8 3.5 - 5.3 mmol/L   Chloride 105 98 - 110 mmol/L   CO2 29 20 - 32 mmol/L   Calcium 9.4 8.6 - 10.4 mg/dL  Cholesterol profile looks good glucose looks good kidney functions look good  Review of Systems  Constitutional: Negative for activity change and appetite change.  HENT: Negative for congestion and rhinorrhea.   Respiratory: Negative for cough and shortness of breath.   Cardiovascular: Negative for chest pain and leg swelling.  Gastrointestinal: Negative for abdominal pain, nausea and vomiting.  Musculoskeletal: Positive for arthralgias, back pain and neck pain.   Skin: Negative for color change.  Neurological: Negative for dizziness and weakness.  Psychiatric/Behavioral: Negative for agitation and confusion.   Results for orders placed or performed in visit on 04/30/20  Lipid panel  Result Value Ref Range   Cholesterol 169 <200 mg/dL   HDL 70 > OR = 50 mg/dL   Triglycerides 086 <578 mg/dL   LDL Cholesterol (Calc) 79 mg/dL (calc)   Total CHOL/HDL Ratio 2.4 <5.0 (calc)   Non-HDL Cholesterol (Calc) 99 <469 mg/dL (calc)  BASIC METABOLIC PANEL WITH GFR  Result Value Ref Range   Glucose, Bld 93 65 - 99 mg/dL   BUN 17 7 - 25 mg/dL   Creat 6.29 5.28 - 4.13 mg/dL   GFR, Est Non African American 99 > OR = 60 mL/min/1.46m2   GFR, Est African American 115 > OR = 60 mL/min/1.69m2   BUN/Creatinine Ratio NOT APPLICABLE 6 - 22 (calc)   Sodium 141 135 - 146 mmol/L   Potassium 4.8 3.5 - 5.3 mmol/L   Chloride 105 98 - 110 mmol/L   CO2 29 20 - 32 mmol/L   Calcium 9.4 8.6 - 10.4 mg/dL       Objective:   Physical Exam Lungs clear respiratory rate normal heart regular negative straight leg raise  bilateral strength good bilateral hands both appear normal subjective discomfort in the left trapezius region from the neck into the shoulder region.  Patient would like to try Cologuard for colonoscopy as long as it is covered     Assessment & Plan:  GERD stable she would like to continue the pantoprazole. Has underlying anxiety and stress related issues trying to do the best she can working 2 jobs she will continue the Prozac she denies being depressed or suicidal.  Clonazepam will be used on a sparing basis for stress but only when she is at home  Musculoskeletal pain in her legs could be related to pinched nerve in her back in addition to this she has what appears to be a cervical nerve impingement into the left trapezius no muscle weakness therefore I would not recommend MRI at this point I would recommend trying diclofenac twice daily over the next few weeks  if she is not seeing significant improvement with this she is notify us we can always pursue plain x-rays of the neck and potentially referral to a back orthopedic specialist.  Morbid obesity try to do the best can at watching diet staying physically active  Otherwise follow-up in 6 months

## 2020-05-27 NOTE — Progress Notes (Signed)
05/27/20- Cologuard paper signed by provider and faxed to Colog[facility]ard

## 2020-11-02 ENCOUNTER — Other Ambulatory Visit: Payer: Self-pay | Admitting: Family Medicine

## 2021-01-01 ENCOUNTER — Other Ambulatory Visit: Payer: Self-pay | Admitting: Family Medicine

## 2021-01-24 ENCOUNTER — Encounter: Payer: Self-pay | Admitting: Nurse Practitioner

## 2021-01-24 ENCOUNTER — Other Ambulatory Visit: Payer: Self-pay

## 2021-01-24 ENCOUNTER — Ambulatory Visit: Payer: PRIVATE HEALTH INSURANCE | Admitting: Nurse Practitioner

## 2021-01-24 ENCOUNTER — Ambulatory Visit (HOSPITAL_COMMUNITY)
Admission: RE | Admit: 2021-01-24 | Discharge: 2021-01-24 | Disposition: A | Discharge status: Home / Self Care | Payer: PRIVATE HEALTH INSURANCE | Source: Ambulatory Visit | Attending: Nurse Practitioner | Admitting: Nurse Practitioner

## 2021-01-24 VITALS — BP 128/74 | HR 83 | Temp 96.4°F | Ht 63.0 in | Wt 264.0 lb

## 2021-01-24 DIAGNOSIS — J988 Other specified respiratory disorders: Secondary | ICD-10-CM | POA: Diagnosis present

## 2021-01-24 MED ORDER — PREDNISONE 20 MG PO TABS: ORAL_TABLET | ORAL | 0 refills | Status: DC

## 2021-01-24 NOTE — Progress Notes (Signed)
Please forward the following message to the message. Message was also sent via Mychart, but I want to be sure the patient got it. Thanks.  Your Xray did not see any pneumonia. Please take your nebulizer, flomax, and prednisone. If not better within 2-3 days please come back in to be re-evaluated. If your SOB gets worse or you are struggling to breath please go to Urgent Care of the ED.

## 2021-01-24 NOTE — Progress Notes (Signed)
Subjective:    Patient ID: Megan Parker, female    DOB: [birth year], 55 y.o.   MRN: [id number]  Patient here for a productive cough with green to yellow mucus, chest tightness, and wheezing x 5 days. Patient has hx of asthma and was around her flu A positive grandson this weekend. Patient admits to some SOB, scratchy throat, nausea, ear pain, headaches, chills, body aches.  Patient denies chest pain, sore throat, palpitations, light headedness, severe dyspnea on exertion.  Patient using albuterol inhaler without much relief. Patient has albuterol nebulizer and Flovent but not using it.    Review of Systems  Constitutional:  Positive for chills and fatigue.  HENT:  Positive for congestion, ear pain and rhinorrhea. Negative for postnasal drip, sinus pressure, sinus pain, sneezing, sore throat, trouble swallowing and voice change.   Respiratory:  Positive for cough, shortness of breath and wheezing.   Cardiovascular:  Negative for chest pain, palpitations and leg swelling.  Gastrointestinal:  Positive for nausea. Negative for diarrhea and vomiting.  Neurological:  Positive for headaches. Negative for light-headedness.      Objective:   Physical Exam Constitutional:      General: She is not in acute distress.    Appearance: Normal appearance. She is not ill-appearing or toxic-appearing.  HENT:     Right Ear: Tympanic membrane, ear canal and external ear normal. There is no impacted cerumen.     Left Ear: Tympanic membrane, ear canal and external ear normal. There is no impacted cerumen.     Nose: Nose normal. No congestion or rhinorrhea.     Mouth/Throat:     Mouth: Mucous membranes are moist.     Pharynx: No oropharyngeal exudate or posterior oropharyngeal erythema.  Eyes:     General:        Right eye: No discharge.        Left eye: No discharge.     Extraocular Movements: Extraocular movements intact.     Conjunctiva/sclera: Conjunctivae normal.     Pupils: Pupils are equal, round,  and reactive to light.  Cardiovascular:     Rate and Rhythm: Normal rate and regular rhythm.     Pulses: Normal pulses.     Heart sounds: No murmur heard. Pulmonary:     Effort: Pulmonary effort is normal. No tachypnea, bradypnea, accessory muscle usage, prolonged expiration, respiratory distress or retractions.     Breath sounds: No stridor. Examination of the right-upper field reveals wheezing. Examination of the left-upper field reveals wheezing. Examination of the right-middle field reveals wheezing. Examination of the left-middle field reveals wheezing. Examination of the right-lower field reveals wheezing. Examination of the left-lower field reveals wheezing. Wheezing present. No rhonchi or rales.  Chest:     Chest wall: No tenderness.  Musculoskeletal:        General: Normal range of motion.     Cervical back: Normal range of motion and neck supple. No rigidity or tenderness.  Lymphadenopathy:     Cervical: No cervical adenopathy.  Skin:    General: Skin is warm.  Neurological:     General: No focal deficit present.     Mental Status: She is alert and oriented to person, place, and time.  Psychiatric:        Mood and Affect: Mood normal.        Behavior: Behavior normal.          Assessment & Plan:   1. Wheezing-associated respiratory infection - Possible asthma exacerbation secondary to  influenza - DG Chest 2 View; Future - COVID-19, Flu A+B and RSV - Prednisone ordered - Use albuterol nebulizer - Use Flovent inhaler - Go to ED if SOB worsens, chest tightness worsens, temperature increases. - Follow up in 2 weeks to evaluate breathing.  - If CXR positive for pneumonia will send it abx.

## 2021-01-25 LAB — COVID-19, FLU A+B AND RSV
Influenza A, NAA: NOT DETECTED
Influenza B, NAA: NOT DETECTED
RSV, NAA: NOT DETECTED
SARS-CoV-2, NAA: NOT DETECTED

## 2021-01-26 ENCOUNTER — Other Ambulatory Visit: Payer: Self-pay | Admitting: Family Medicine

## 2021-01-31 ENCOUNTER — Other Ambulatory Visit: Payer: Self-pay | Admitting: Family Medicine

## 2021-02-07 ENCOUNTER — Ambulatory Visit: Payer: PRIVATE HEALTH INSURANCE | Admitting: Nurse Practitioner

## 2021-03-02 ENCOUNTER — Other Ambulatory Visit: Payer: Self-pay | Admitting: Family Medicine

## 2021-03-31 ENCOUNTER — Other Ambulatory Visit: Payer: Self-pay | Admitting: Family Medicine

## 2021-03-31 NOTE — Telephone Encounter (Signed)
Duplicate request

## 2021-03-31 NOTE — Telephone Encounter (Signed)
Pt called and stated she needed a refill for Diclofenac 75 mg to [facility] in [place].   [phone]

## 2021-04-29 ENCOUNTER — Emergency Department (HOSPITAL_COMMUNITY)
Admission: EM | Admit: 2021-04-29 | Discharge: 2021-04-29 | Disposition: A | Discharge status: Home / Self Care | Payer: PRIVATE HEALTH INSURANCE | Attending: Emergency Medicine | Admitting: Emergency Medicine

## 2021-04-29 ENCOUNTER — Encounter (HOSPITAL_COMMUNITY): Payer: Self-pay | Admitting: *Deleted

## 2021-04-29 DIAGNOSIS — M545 Low back pain, unspecified: Secondary | ICD-10-CM | POA: Diagnosis present

## 2021-04-29 DIAGNOSIS — M5442 Lumbago with sciatica, left side: Secondary | ICD-10-CM | POA: Insufficient documentation

## 2021-04-29 DIAGNOSIS — M5432 Sciatica, left side: Secondary | ICD-10-CM

## 2021-04-29 MED ORDER — KETOROLAC TROMETHAMINE 15 MG/ML IJ SOLN
15.0000 mg | Freq: Once | INTRAMUSCULAR | Status: AC
Start: 2021-04-29 — End: 2021-04-29
  Administered 2021-04-29: 15 mg via INTRAMUSCULAR
  Filled 2021-04-29: qty 1

## 2021-04-29 MED ORDER — FENTANYL CITRATE PF 50 MCG/ML IJ SOSY
25.0000 ug | PREFILLED_SYRINGE | Freq: Once | INTRAMUSCULAR | Status: AC
  Administered 2021-04-29: 25 ug via INTRAMUSCULAR
  Filled 2021-04-29: qty 1

## 2021-04-29 MED ORDER — CYCLOBENZAPRINE HCL 10 MG PO TABS: 10.0000 mg | ORAL_TABLET | Freq: Two times a day (BID) | ORAL | 0 refills | Status: DC | PRN

## 2021-04-29 MED ORDER — CYCLOBENZAPRINE HCL 10 MG PO TABS
10.0000 mg | ORAL_TABLET | Freq: Once | ORAL | Status: AC
Start: 2021-04-29 — End: 2021-04-29
  Administered 2021-04-29: 10 mg via ORAL
  Filled 2021-04-29: qty 1

## 2021-04-29 MED ORDER — NAPROXEN 500 MG PO TABS: 500.0000 mg | ORAL_TABLET | Freq: Two times a day (BID) | ORAL | 0 refills | Status: DC

## 2021-04-29 MED ORDER — LIDOCAINE 5 % EX PTCH: 1.0000 | MEDICATED_PATCH | CUTANEOUS | 0 refills | Status: DC

## 2021-04-29 MED ORDER — LIDOCAINE 5 % EX PTCH
1.0000 | MEDICATED_PATCH | CUTANEOUS | Status: DC
  Administered 2021-04-29: 1 via TRANSDERMAL
  Filled 2021-04-29: qty 1

## 2021-04-29 NOTE — Discharge Instructions (Addendum)
Please follow-up with your primary care doctor for further evaluation.  Please take naproxen which is anti-inflammatory, Flexeril which is a muscle relaxer, and Lidoderm patches as needed for your sciatica pain.  Please do not mix Flexeril with alcohol or operate heavy machinery as this can make you sleepy.  I would like for you to return to the emergency department for any worsening symptoms you might have. ?

## 2021-04-29 NOTE — ED Provider Notes (Signed)
?[facility] ?Provider Note ? ? ?CSN: [id number] ?Arrival date & time: 04/29/21  1617 ? ?  ? ?History ?Chief Complaint  ?Patient presents with  ? Back Pain  ? ? ?Megan Parker is a 56 y.o. female with history of sciatica who presents to the emergency department with left lower back pain that radiates down the back of the left leg started roughly a week ago.  Patient was seen and evaluated at urgent care on Monday and was given a shot of prednisone which briefly improved her pain.  Pain got worse again today and she visited urgent care who then referred her to an emergency orthopedic urgent care who did not take her insurance and was sent here for further evaluation.  Patient denies any weakness or numbness to the lower extremities.  No bowel or bladder incontinence.  No fever or chills. ? ? ?Back Pain ? ?  ? ?Home Medications ?Prior to Admission medications   ?Medication Sig Start Date End Date Taking? Authorizing Provider  ?cyclobenzaprine (FLEXERIL) 10 MG tablet Take 1 tablet (10 mg total) by mouth 2 (two) times daily as needed for muscle spasms. 04/29/21  Yes Maryem Shuffler M, PA-C  ?lidocaine (LIDODERM) 5 % Place 1 patch onto the skin daily. Remove & Discard patch within 12 hours or as directed by MD 04/29/21  Yes Raul Del, Xee Hollman M, PA-C  ?naproxen (NAPROSYN) 500 MG tablet Take 1 tablet (500 mg total) by mouth 2 (two) times daily. 04/29/21  Yes Raul Del, Tejah Brekke M, PA-C  ?albuterol (PROVENTIL) (2.5 MG/3ML) 0.083% nebulizer solution Use via neb q 4 hrs prn wheezing 01/28/18   Cheyenne Adas, NP  ?albuterol (VENTOLIN HFA) 108 (90 Base) MCG/ACT inhaler Inhale 2 puffs every 4 hrs prn 03/15/20   Kathyrn Drown, MD  ?clonazePAM (KLONOPIN) 0.5 MG tablet Take 1/2-1 tab po BID prn anxiety 05/26/20   Kathyrn Drown, MD  ?diclofenac (VOLTAREN) 75 MG EC tablet Take 1 tablet by mouth twice daily 03/31/21   Kathyrn Drown, MD  ?FLUoxetine (PROZAC) 40 MG capsule TAKE 2 CAPSULES BY MOUTH ONCE DAILY. NEEDS FOLLOW UP  BY THE END OF THE YEAR 05/26/20   Kathyrn Drown, MD  ?fluticasone (FLOVENT HFA) 110 MCG/ACT inhaler Inhale 2 puffs into the lungs 2 (two) times daily. To prevent wheezing 05/26/20   Kathyrn Drown, MD  ?pantoprazole (PROTONIX) 40 MG tablet Take 1 tablet by mouth once daily 03/02/21   Kathyrn Drown, MD  ?predniSONE (DELTASONE) 20 MG tablet 3 po qd x 3 d then 2 po qd x 3 d then 1 po qd x 3 d 01/24/21   Ameduite, Trenton Gammon, NP  ?   ? ?Allergies    ?Chantix [varenicline], Codeine, Levaquin [levofloxacin], and Wellbutrin [bupropion]   ? ?Review of Systems   ?Review of Systems  ?Musculoskeletal:  Positive for back pain.  ?All other systems reviewed and are negative. ? ?Physical Exam ?Updated Vital Signs ?BP (!) 145/85 (BP Location: Right Arm)   Pulse 95   Temp 98.9 ?F (37.2 ?C) (Oral)   Resp 20   Ht 5\' 3"  (1.6 m)   Wt 118.8 kg   SpO2 97%   BMI 46.41 kg/m?  ?Physical Exam ?Vitals and nursing note reviewed.  ?Constitutional:   ?   Appearance: Normal appearance.  ?HENT:  ?   Head: Normocephalic and atraumatic.  ?Eyes:  ?   General:     ?   Right eye: No discharge.     ?  Left eye: No discharge.  ?   Conjunctiva/sclera: Conjunctivae normal.  ?Pulmonary:  ?   Effort: Pulmonary effort is normal.  ?Musculoskeletal:  ?   Comments: No midline tenderness over the lumbar spine.  There is point tenderness over the left SI region. 5/5 strength to the lower extremities. Normal sensation to the lower extremities.   ?Skin: ?   General: Skin is warm and dry.  ?   Findings: No rash.  ?Neurological:  ?   General: No focal deficit present.  ?   Mental Status: She is alert.  ?Psychiatric:     ?   Mood and Affect: Mood normal.     ?   Behavior: Behavior normal.  ? ? ?ED Results / Procedures / Treatments   ?Labs ?(all labs ordered are listed, but only abnormal results are displayed) ?Labs Reviewed - No data to display ? ?EKG ?None ? ?Radiology ?No results found. ? ?Procedures ?Procedures  ? ? ?Medications Ordered in ED ?Medications   ?lidocaine (LIDODERM) 5 % 1 patch (1 patch Transdermal Patch Applied 04/29/21 1700)  ?cyclobenzaprine (FLEXERIL) tablet 10 mg (has no administration in time range)  ?ketorolac (TORADOL) 15 MG/ML injection 15 mg (15 mg Intramuscular Given 04/29/21 1700)  ?fentaNYL (SUBLIMAZE) injection 25 mcg (25 mcg Intramuscular Given 04/29/21 1805)  ? ? ?ED Course/ Medical Decision Making/ A&P ?Clinical Course as of 04/29/21 1851  ?Fri Apr 29, 2021  ?1808 On reevaluation, Toradol and Lidoderm patch did not improve her back pain.  We will give her 25 mg of fentanyl. [CF]  ?1828 After fentanyl patient is able to get out of bed and move around with a limp. [CF]  ?  ?Clinical Course User Index ?[CF] Myna Bright M, PA-C  ? ?                        ?Medical Decision Making ?Risk ?Prescription drug management. ? ? ?Megan Parker is a 56 y.o. female who presents to the emergency department with left lower back pain.  Differential diagnosis includes sciatica, musculoskeletal strain.  I have a low suspicion for spinal cord pathology at this time including cauda equina, epidural abscess, or fracture or dislocation.  History and physical exam is consistent with sciatica.  She was already given steroids x2 had a couple of urgent cares and is still currently taking steroids.  Initial round of pain medications including Toradol and Lidoderm patch did not help.  Fentanyl did improve pain somewhat.  Ultimately, patient will likely benefit from further evaluation outside hospital.  I will prescribe her a prescription for naproxen, muscle relaxers, and Lidoderm patches that she can take in addition to her steroids.  This is goal-directed medical therapy.  She was encouraged to return to the emergency department at any particular time for any questions or concerns she might have. She is safe for discharge. ? ?Final Clinical Impression(s) / ED Diagnoses ?Final diagnoses:  ?Sciatica of left side  ? ? ?Rx / DC Orders ?ED Discharge Orders   ? ?       Ordered  ?  lidocaine (LIDODERM) 5 %  Every 24 hours       ? 04/29/21 1850  ?  cyclobenzaprine (FLEXERIL) 10 MG tablet  2 times daily PRN       ? 04/29/21 1850  ?  naproxen (NAPROSYN) 500 MG tablet  2 times daily       ? 04/29/21 1850  ? ?  ?  ? ?  ? ? ?  ?  Hendricks Limes, PA-C ?04/29/21 1853 ? ?  ?Dorie Rank, MD ?04/30/21 1619 ? ?

## 2021-04-29 NOTE — ED Triage Notes (Signed)
Back pain, history of sciatica ?

## 2021-05-02 ENCOUNTER — Ambulatory Visit: Payer: PRIVATE HEALTH INSURANCE | Admitting: Family Medicine

## 2021-05-02 ENCOUNTER — Encounter: Payer: Self-pay | Admitting: Family Medicine

## 2021-05-02 ENCOUNTER — Other Ambulatory Visit: Payer: Self-pay

## 2021-05-02 ENCOUNTER — Telehealth: Payer: Self-pay | Admitting: *Deleted

## 2021-05-02 ENCOUNTER — Ambulatory Visit (HOSPITAL_COMMUNITY)
Admission: RE | Admit: 2021-05-02 | Discharge: 2021-05-02 | Disposition: A | Discharge status: Home / Self Care | Payer: PRIVATE HEALTH INSURANCE | Source: Ambulatory Visit | Attending: Family Medicine | Admitting: Family Medicine

## 2021-05-02 VITALS — BP 142/84 | Temp 97.2°F | Wt 261.2 lb

## 2021-05-02 DIAGNOSIS — M5432 Sciatica, left side: Secondary | ICD-10-CM | POA: Diagnosis present

## 2021-05-02 DIAGNOSIS — R29898 Other symptoms and signs involving the musculoskeletal system: Secondary | ICD-10-CM | POA: Diagnosis not present

## 2021-05-02 MED ORDER — PREDNISONE 20 MG PO TABS: ORAL_TABLET | ORAL | 0 refills | Status: DC

## 2021-05-02 MED ORDER — HYDROCODONE-ACETAMINOPHEN 10-325 MG PO TABS: 1.0000 | ORAL_TABLET | ORAL | 0 refills | Status: DC | PRN

## 2021-05-02 MED ORDER — HYDROCODONE-ACETAMINOPHEN 10-325 MG PO TABS: 1.0000 | ORAL_TABLET | Freq: Four times a day (QID) | ORAL | 0 refills | Status: AC | PRN

## 2021-05-02 NOTE — Telephone Encounter (Signed)
[facility] pharmacist notified of the change. ?

## 2021-05-02 NOTE — Telephone Encounter (Signed)
[facility] pharmacy called and stated that rx for the Norco was sent over. She says the pt is considered naive and the MME is at 60.  She said could it be dropped to 5 mg every 4 hours or to keep it at 10 mg and  do every 6 hours. Please advise.  Thank you ?

## 2021-05-02 NOTE — Telephone Encounter (Signed)
Please let [facility] know that I sent in a new prescription for the 10 mg every 6 hours as needed severe pain, #20 this will take the place of the previous one thank you ?

## 2021-05-02 NOTE — Progress Notes (Signed)
? ?  Subjective:  ? ? Patient ID: Megan Parker, female    DOB: 14-Oct-1965, 56 y.o.   MRN: [id number] ? ?HPI ?Pt having left leg pain and back pain. Pt has been to two Urgent Cares and ER. Pt receives shot and then "they send me home". Pain is not as bad as it was 04/29/21 but last night and today pain has increased. Pt has a burning feeling and cramping feel. Pt report tingling also. Muscle relaxer's have not been helping. Only relief is when pt lays on stomach with hands above head ?Patient relates a lot of pain discomfort in her back down into her leg ?Review of Systems ? ?   ?Objective:  ? Physical Exam ? ?Lungs clear heart regular ?Positive straight leg raise on the left ?Weakness in the foot ?Great toe cannot extend properly ? ? ? ?   ?Assessment & Plan:  ?Muscle weakness along with sciatica ?Urgent MRI indicated ?Hydrocodone 10 mg 1 every 6 hours as needed severe pain caution drowsiness ?If progressive troubles or if worse to follow-up immediately ?Patient is aware that pain medicine can make her drowsy and not to drive with it ? ?

## 2021-05-03 ENCOUNTER — Telehealth: Payer: Self-pay | Admitting: Family Medicine

## 2021-05-03 DIAGNOSIS — R29898 Other symptoms and signs involving the musculoskeletal system: Secondary | ICD-10-CM

## 2021-05-03 DIAGNOSIS — M5432 Sciatica, left side: Secondary | ICD-10-CM

## 2021-05-03 NOTE — Telephone Encounter (Signed)
Urgent referral ordered in Epic- spoke with [facility] who stated they would speak with provider to process patient to be seen this week and would need to call to make sure they accept the patient's insurance. ?

## 2021-05-03 NOTE — Telephone Encounter (Signed)
Patient advised of results of MRI- per Dr Nicki Reaper: central herniation and impingement of the L5 root. Patient verbalized understanding. ?

## 2021-05-03 NOTE — Telephone Encounter (Signed)
Neurosurgery consult urgent ?Patient with foot weakness ?Also with severe sciatica L5 compression ?First talk with their office to see if they can urgently see her this week?  If the front office goes ahead and gives you an appointment for this week there is no need to speak with the neurosurgeon ? if they cannot see her this week-or state they cannot give you an appointment then is it possible that I could speak with neurosurgeon? ?If possible let me speak with PA or neurosurgeon they can call me on my cell phone [phone] ?

## 2021-05-04 NOTE — Telephone Encounter (Signed)
Patient called and delayed her appt with neurosurgeon while she is trying to get insurance issue worked out and see if they have a preferred provider under her plan. Patient is working through her HR department and will notify office if there is anything further we need to do ?

## 2021-05-04 NOTE — Telephone Encounter (Signed)
Nurses-according to my last discussion with you all patient was going to be seen by neurosurgery but having to pay some out-of-pocket.  Unfortunately there is not much we can do about that.  If there are any ongoing issues or if she needs a different referral let me know thanks ?

## 2021-05-09 ENCOUNTER — Telehealth: Payer: Self-pay | Admitting: *Deleted

## 2021-05-09 ENCOUNTER — Encounter: Payer: Self-pay | Admitting: Family Medicine

## 2021-05-09 NOTE — Telephone Encounter (Signed)
Patient has appt with neurosurgeon 05/11/21 and needs work note extension till her appt with specialist. Patient would like it sent thru My Chart. ?

## 2021-05-09 NOTE — Telephone Encounter (Signed)
She may have a work excuse for Monday Tuesday Wednesday ?Work excuse after that will be via the specialist ?

## 2021-05-13 ENCOUNTER — Telehealth: Payer: Self-pay | Admitting: *Deleted

## 2021-05-13 ENCOUNTER — Other Ambulatory Visit: Payer: Self-pay | Admitting: Family Medicine

## 2021-05-13 MED ORDER — NAPROXEN 500 MG PO TABS: 500.0000 mg | ORAL_TABLET | Freq: Two times a day (BID) | ORAL | 0 refills | Status: DC

## 2021-05-13 MED ORDER — HYDROCODONE-ACETAMINOPHEN 10-325 MG PO TABS
1.0000 | ORAL_TABLET | Freq: Three times a day (TID) | ORAL | 0 refills | Status: AC | PRN
Start: 2021-05-13 — End: 2021-05-18

## 2021-05-13 NOTE — Telephone Encounter (Signed)
Patient seen with sciatica- MRI showed nerve root compression from herniated disc- seen neurosurgeon and is awaiting surgery and needs refill of pain medication ? ?Last given Hydrocodone 10mg  #20 one tab every 6 hours also wants to know if she continues naproxen and if so needs refill ? ?[facility] [place] ?

## 2021-05-13 NOTE — Telephone Encounter (Signed)
Tommie Sams, DO   ? ?Prescriptions sent to the pharmacy  ? ?

## 2021-05-13 NOTE — Telephone Encounter (Signed)
Patient notified

## 2021-05-26 ENCOUNTER — Telehealth: Payer: Self-pay | Admitting: *Deleted

## 2021-05-26 NOTE — Telephone Encounter (Signed)
Patient would like a refill of hydrocodone till her back surgery 06/08/21- last filled 05/13/21 #15 ? ?[facility] in [place] ?

## 2021-05-30 ENCOUNTER — Other Ambulatory Visit: Payer: Self-pay | Admitting: Family Medicine

## 2021-05-30 MED ORDER — HYDROCODONE-ACETAMINOPHEN 10-325 MG PO TABS: 1.0000 | ORAL_TABLET | ORAL | 0 refills | Status: AC | PRN

## 2021-05-30 NOTE — Telephone Encounter (Signed)
Prescription was sent in as requested, [place] [facility] ?

## 2021-05-30 NOTE — Telephone Encounter (Signed)
Patient notified

## 2021-06-09 ENCOUNTER — Other Ambulatory Visit: Payer: Self-pay

## 2021-06-09 MED ORDER — FLUOXETINE HCL 40 MG PO CAPS: ORAL_CAPSULE | ORAL | 5 refills | Status: DC

## 2021-06-10 MED ORDER — DICLOFENAC SODIUM 75 MG PO TBEC: 75.0000 mg | DELAYED_RELEASE_TABLET | Freq: Two times a day (BID) | ORAL | 1 refills | Status: DC

## 2021-09-06 ENCOUNTER — Other Ambulatory Visit: Payer: Self-pay | Admitting: Family Medicine

## 2021-09-18 ENCOUNTER — Other Ambulatory Visit: Payer: Self-pay | Admitting: Family Medicine

## 2021-10-05 ENCOUNTER — Other Ambulatory Visit: Payer: Self-pay | Admitting: Family Medicine

## 2021-10-06 ENCOUNTER — Ambulatory Visit (INDEPENDENT_AMBULATORY_CARE_PROVIDER_SITE_OTHER): Payer: PRIVATE HEALTH INSURANCE | Admitting: Family Medicine

## 2021-10-06 ENCOUNTER — Encounter: Payer: Self-pay | Admitting: Family Medicine

## 2021-10-06 VITALS — BP 135/81 | HR 55 | Wt 265.8 lb

## 2021-10-06 DIAGNOSIS — R200 Anesthesia of skin: Secondary | ICD-10-CM

## 2021-10-06 DIAGNOSIS — G2581 Restless legs syndrome: Secondary | ICD-10-CM

## 2021-10-06 DIAGNOSIS — F418 Other specified anxiety disorders: Secondary | ICD-10-CM

## 2021-10-06 DIAGNOSIS — R5383 Other fatigue: Secondary | ICD-10-CM | POA: Diagnosis not present

## 2021-10-06 DIAGNOSIS — L301 Dyshidrosis [pompholyx]: Secondary | ICD-10-CM | POA: Diagnosis not present

## 2021-10-06 DIAGNOSIS — R400 Somnolence: Secondary | ICD-10-CM | POA: Diagnosis not present

## 2021-10-06 DIAGNOSIS — G8929 Other chronic pain: Secondary | ICD-10-CM

## 2021-10-06 DIAGNOSIS — M545 Low back pain, unspecified: Secondary | ICD-10-CM | POA: Diagnosis not present

## 2021-10-06 MED ORDER — ROPINIROLE HCL 1 MG PO TABS: 1.0000 mg | ORAL_TABLET | Freq: Every day | ORAL | 4 refills | Status: DC

## 2021-10-06 MED ORDER — PANTOPRAZOLE SODIUM 40 MG PO TBEC: 40.0000 mg | DELAYED_RELEASE_TABLET | Freq: Every day | ORAL | 0 refills | Status: DC

## 2021-10-06 MED ORDER — MOMETASONE FUROATE 0.1 % EX CREA
TOPICAL_CREAM | CUTANEOUS | 1 refills | Status: DC
Start: 2021-10-06 — End: 2021-11-25

## 2021-10-06 MED ORDER — FLUOXETINE HCL 40 MG PO CAPS: ORAL_CAPSULE | ORAL | 5 refills | Status: DC

## 2021-10-06 MED ORDER — DICLOFENAC SODIUM 75 MG PO TBEC
75.0000 mg | DELAYED_RELEASE_TABLET | Freq: Two times a day (BID) | ORAL | 5 refills | Status: DC
Start: 2021-10-06 — End: 2022-12-14

## 2021-10-06 NOTE — Progress Notes (Signed)
   Subjective:    Patient ID: Megan Parker, female    DOB: [birth year], 56 y.o.   MRN: [id number]  HPI Pt arrives today for follow up.   Pt states she is having discomfort in left ear.   Burning sensation in legs and feet; legs constantly jerk.   Pt has also been fatigued more than normal. Daytime somnolence - Plan: Ambulatory referral to Neurology  Other fatigue - Plan: Vitamin B12, TSH, T4, Free, CBC with Differential, Comprehensive metabolic panel, Ambulatory referral to Neurology  Dyshidrotic eczema  Chronic bilateral low back pain, unspecified whether sciatica present - Plan: Vitamin B12, TSH, T4, Free, CBC with Differential, Comprehensive metabolic panel  Morbid obesity (HCC) - Plan: Vitamin B12, TSH, T4, Free, CBC with Differential, Comprehensive metabolic panel  Depression with anxiety  Leg numbness - Plan: Vitamin B12, TSH, T4, Free, CBC with Differential, Comprehensive metabolic panel  Restless legs - Plan: Vitamin B12, TSH, T4, Free, CBC with Differential, Comprehensive metabolic panel   Review of Systems     Objective:   Physical Exam General-in no acute distress Eyes-no discharge Lungs-respiratory rate normal, CTA CV-no murmurs,RRR Extremities skin warm dry no edema Neuro grossly normal Behavior normal, alert        Assessment & Plan:  1. Daytime somnolence She has a lot of fatigue tiredness during the day finds herself feeling sleepy during the day falling asleep frequently for naps in addition to this restless at nighttime restless legs trying to get to sleep under a lot of stress does not know if she snores Does not wake up refreshed. She has all the symptoms of sleep apnea along with restless legs it would be reasonable to do sleep study - Ambulatory referral to Neurology  2. Other fatigue Lab work ordered because of significant fatigue to rule out other causes - Vitamin B12 - TSH - T4, Free - CBC with Differential - Comprehensive metabolic  panel - Ambulatory referral to Neurology  3. Dyshidrotic eczema Steroid cream prescribed for dyshidrotic eczema on her hand and foot  4. Chronic bilateral low back pain, unspecified whether sciatica present Chronic low back pain and discomfort anti-inflammatories as needed but if having any GI side effects or problems to notify us - Vitamin B12 - TSH - T4, Free - CBC with Differential - Comprehensive metabolic panel  5. Morbid obesity (HCC) Portion control regular physical activity recommended - Vitamin B12 - TSH - T4, Free - CBC with Differential - Comprehensive metabolic panel  6. Depression with anxiety Continue antidepressant.  Now taking care of grandchild  7. Leg numbness Interim numb leg numbness more than likely related to her back but we will check a B12 - Vitamin B12 - TSH - T4, Free - CBC with Differential - Comprehensive metabolic panel  8. Restless legs Prescription made for this medicine.  Follow-up if any ongoing troubles To give [place] update within 3 to 4 weeks how this is doing - Vitamin B12 - TSH - T4, Free - CBC with Differential - Comprehensive metabolic panel

## 2021-10-07 ENCOUNTER — Other Ambulatory Visit: Payer: Self-pay | Admitting: *Deleted

## 2021-10-07 DIAGNOSIS — Z1211 Encounter for screening for malignant neoplasm of colon: Secondary | ICD-10-CM

## 2021-10-07 DIAGNOSIS — Z1231 Encounter for screening mammogram for malignant neoplasm of breast: Secondary | ICD-10-CM

## 2021-10-07 LAB — COMPREHENSIVE METABOLIC PANEL
AG Ratio: 2.1 (calc) (ref 1.0–2.5)
ALT: 24 U/L (ref 6–29)
AST: 17 U/L (ref 10–35)
Albumin: 4.2 g/dL (ref 3.6–5.1)
Alkaline phosphatase (APISO): 100 U/L (ref 37–153)
BUN: 18 mg/dL (ref 7–25)
CO2: 29 mmol/L (ref 20–32)
Calcium: 9.2 mg/dL (ref 8.6–10.4)
Chloride: 106 mmol/L (ref 98–110)
Creat: 0.76 mg/dL (ref 0.50–1.03)
Globulin: 2 g/dL (calc) (ref 1.9–3.7)
Glucose, Bld: 79 mg/dL (ref 65–139)
Potassium: 4.9 mmol/L (ref 3.5–5.3)
Sodium: 141 mmol/L (ref 135–146)
Total Bilirubin: 0.3 mg/dL (ref 0.2–1.2)
Total Protein: 6.2 g/dL (ref 6.1–8.1)

## 2021-10-07 LAB — CBC WITH DIFFERENTIAL/PLATELET
Absolute Monocytes: 608 cells/uL (ref 200–950)
Basophils Absolute: 60 cells/uL (ref 0–200)
Basophils Relative: 0.8 %
Eosinophils Absolute: 203 cells/uL (ref 15–500)
Eosinophils Relative: 2.7 %
HCT: 38.9 % (ref 35.0–45.0)
Hemoglobin: 12.6 g/dL (ref 11.7–15.5)
Lymphs Abs: 2273 cells/uL (ref 850–3900)
MCH: 26.8 pg — ABNORMAL LOW (ref 27.0–33.0)
MCHC: 32.4 g/dL (ref 32.0–36.0)
MCV: 82.8 fL (ref 80.0–100.0)
MPV: 11.7 fL (ref 7.5–12.5)
Monocytes Relative: 8.1 %
Neutro Abs: 4358 cells/uL (ref 1500–7800)
Neutrophils Relative %: 58.1 %
Platelets: 240 10*3/uL (ref 140–400)
RBC: 4.7 10*6/uL (ref 3.80–5.10)
RDW: 12.9 % (ref 11.0–15.0)
Total Lymphocyte: 30.3 %
WBC: 7.5 10*3/uL (ref 3.8–10.8)

## 2021-10-07 LAB — VITAMIN B12: Vitamin B-12: 286 pg/mL (ref 200–1100)

## 2021-10-07 LAB — T4, FREE: Free T4: 1.1 ng/dL (ref 0.8–1.8)

## 2021-10-07 LAB — TSH: TSH: 1.43 mIU/L

## 2021-10-07 NOTE — Progress Notes (Signed)
a 

## 2021-10-07 NOTE — Progress Notes (Signed)
Referral for colonoscopy ordered in Epic, Mammo scheduled-patient aware

## 2021-10-12 ENCOUNTER — Ambulatory Visit (HOSPITAL_COMMUNITY)
Admission: RE | Admit: 2021-10-12 | Discharge: 2021-10-12 | Disposition: A | Discharge status: Home / Self Care | Payer: PRIVATE HEALTH INSURANCE | Source: Ambulatory Visit | Attending: Family Medicine | Admitting: Family Medicine

## 2021-10-12 DIAGNOSIS — Z1231 Encounter for screening mammogram for malignant neoplasm of breast: Secondary | ICD-10-CM | POA: Diagnosis not present

## 2021-10-19 ENCOUNTER — Encounter: Payer: Self-pay | Admitting: *Deleted

## 2021-11-11 ENCOUNTER — Encounter: Payer: Self-pay | Admitting: Nurse Practitioner

## 2021-11-11 ENCOUNTER — Ambulatory Visit (INDEPENDENT_AMBULATORY_CARE_PROVIDER_SITE_OTHER): Payer: PRIVATE HEALTH INSURANCE | Admitting: Nurse Practitioner

## 2021-11-11 VITALS — BP 139/84 | HR 74 | Temp 96.6°F | Ht 63.0 in | Wt 265.0 lb

## 2021-11-11 DIAGNOSIS — Z01419 Encounter for gynecological examination (general) (routine) without abnormal findings: Secondary | ICD-10-CM

## 2021-11-11 DIAGNOSIS — Z122 Encounter for screening for malignant neoplasm of respiratory organs: Secondary | ICD-10-CM | POA: Diagnosis not present

## 2021-11-11 MED ORDER — PHENTERMINE HCL 37.5 MG PO TABS: 37.5000 mg | ORAL_TABLET | Freq: Every day | ORAL | 0 refills | Status: DC
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
## 2021-11-13 NOTE — Progress Notes (Signed)
   Subjective:    Patient ID: Megan Parker, female    DOB: 29-Mar-1965, 56 y.o.   MRN: [id number]  HPI    Review of Systems     Objective:   Physical Exam        Assessment & Plan:

## 2021-11-17 ENCOUNTER — Institutional Professional Consult (permissible substitution): Payer: PRIVATE HEALTH INSURANCE | Admitting: Neurology

## 2021-11-17 ENCOUNTER — Encounter: Payer: Self-pay | Admitting: *Deleted

## 2021-11-17 NOTE — Patient Instructions (Signed)
Procedure: Colonoscopy  Estimated body mass index is 46.06 kg/m as calculated from the following:   Height as of this encounter: 5\' 3"  (1.6 m).   Weight as of this encounter: 260 lb (117.9 kg).   Have you had a colonoscopy before?  no  Do you have family history of colon cancer  no  Do you have a family history of polyps? yes  Previous colonoscopy with polyps removed? no  Do you have a history colorectal cancer?   no  Are you diabetic?  no  Do you have a prosthetic or mechanical heart valve? no  Do you have a pacemaker/defibrillator?   no  Have you had endocarditis/atrial fibrillation?  no  Do you use supplemental oxygen/CPAP?  no  Have you had joint replacement within the last 12 months?  no  Do you tend to be constipated or have to use laxatives?  no   Do you have history of alcohol use? If yes, how much and how often.  no  Do you have history or are you using drugs? If yes, what do are you  using?  no  Have you ever had a stroke/heart attack?  no  Have you ever had a heart or other vascular stent placed,?no  Do you take weight loss medication? no  female patients,: have you had a hysterectomy? no                              are you post menopausal?  yes                              do you still have your menstrual cycle? no    Date of last menstrual period.   Do you take any blood-thinning medications such as: (Plavix, aspirin, Coumadin, Aggrenox, Brilinta, Xarelto, Eliquis, Pradaxa, Savaysa or Effient) no  If yes we need the name, milligram, dosage and who is prescribing doctor:               Current Outpatient Medications  Medication Sig Dispense Refill   clonazePAM (KLONOPIN) 0.5 MG tablet Take 1/2-1 tab po BID prn anxiety 20 tablet 1   diclofenac (VOLTAREN) 75 MG EC tablet Take 1 tablet (75 mg total) by mouth 2 (two) times daily. 60 tablet 5   FLUoxetine (PROZAC) 40 MG capsule TAKE 2 CAPSULES BY MOUTH ONCE DAILY. NEEDS FOLLOW UP BY THE END OF THE YEAR  60 capsule 5   lidocaine (LIDODERM) 5 % Place 1 patch onto the skin daily. Remove & Discard patch within 12 hours or as directed by MD 30 patch 0   mometasone (ELOCON) 0.1 % cream Apply bid prn to affected area 90 g 1   pantoprazole (PROTONIX) 40 MG tablet Take 1 tablet (40 mg total) by mouth daily. 90 tablet 0   phentermine (ADIPEX-P) 37.5 MG tablet Take 1 tablet (37.5 mg total) by mouth daily before breakfast. 30 tablet 0   rOPINIRole (REQUIP) 1 MG tablet Take 1 tablet (1 mg total) by mouth at bedtime. 30 tablet 4   No current facility-administered medications for this visit.    Allergies  Allergen Reactions   Chantix [Varenicline]     Severe nightmares   Codeine Itching   Levaquin [Levofloxacin] Other (See Comments)    Reaction unknown to patient    Wellbutrin [Bupropion]     Made her feel sick

## 2021-11-25 ENCOUNTER — Other Ambulatory Visit: Payer: Self-pay | Admitting: Family Medicine

## 2021-11-28 NOTE — Progress Notes (Signed)
OK to schedule. ASA 3 due to BMI.   Needs to hold phentermine x 1 week prior.

## 2021-11-29 ENCOUNTER — Encounter: Payer: Self-pay | Admitting: *Deleted

## 2021-11-29 MED ORDER — PEG 3350-KCL-NA BICARB-NACL 420 G PO SOLR: 4000.0000 mL | Freq: Once | ORAL | 0 refills | Status: AC

## 2021-11-29 NOTE — Progress Notes (Signed)
LMOVM to call back 

## 2021-11-29 NOTE — Progress Notes (Signed)
Faxed utilization management notification form to insurance

## 2021-11-29 NOTE — Progress Notes (Signed)
Pt called back. Scheduled for 11/10 at 9:30am. Aware will send instructions via mcyhart and rx for prep sent to pharmacy.

## 2021-12-02 NOTE — Progress Notes (Signed)
PA approved. Auth# [id number], DOS: 12/30/21-01/29/22

## 2021-12-06 ENCOUNTER — Telehealth: Payer: Self-pay | Admitting: Internal Medicine

## 2021-12-06 NOTE — Telephone Encounter (Signed)
Noted. Message sent to endo

## 2021-12-06 NOTE — Telephone Encounter (Signed)
Pt called and wants to cancel her colonoscopy on 11/10.  She will call back to rescehdule

## 2021-12-16 ENCOUNTER — Ambulatory Visit (INDEPENDENT_AMBULATORY_CARE_PROVIDER_SITE_OTHER): Payer: PRIVATE HEALTH INSURANCE | Admitting: Nurse Practitioner

## 2021-12-16 VITALS — BP 123/70 | HR 83 | Temp 97.3°F | Ht 63.0 in | Wt 266.0 lb

## 2021-12-16 DIAGNOSIS — I10 Essential (primary) hypertension: Secondary | ICD-10-CM

## 2021-12-16 DIAGNOSIS — K219 Gastro-esophageal reflux disease without esophagitis: Secondary | ICD-10-CM | POA: Diagnosis not present

## 2021-12-16 DIAGNOSIS — R350 Frequency of micturition: Secondary | ICD-10-CM

## 2021-12-16 LAB — POCT URINALYSIS DIP (CLINITEK)
Bilirubin, UA: NEGATIVE
Blood, UA: NEGATIVE
Glucose, UA: NEGATIVE mg/dL
Ketones, POC UA: NEGATIVE mg/dL
Leukocytes, UA: NEGATIVE
Nitrite, UA: NEGATIVE
POC PROTEIN,UA: NEGATIVE
Spec Grav, UA: 1.015 (ref 1.010–1.025)
Urobilinogen, UA: 0.2 E.U./dL
pH, UA: 6 (ref 5.0–8.0)

## 2021-12-16 LAB — GLUCOSE, POCT (MANUAL RESULT ENTRY): POC Glucose: 102 mg/dl — AB (ref 70–99)

## 2021-12-16 MED ORDER — PANTOPRAZOLE SODIUM 40 MG PO TBEC: 40.0000 mg | DELAYED_RELEASE_TABLET | Freq: Every day | ORAL | 0 refills | Status: DC

## 2021-12-16 MED ORDER — LOSARTAN POTASSIUM 25 MG PO TABS: 25.0000 mg | ORAL_TABLET | Freq: Every day | ORAL | 0 refills | Status: DC

## 2021-12-16 NOTE — Progress Notes (Unsigned)
Subjective:    Patient ID: Megan Parker, female    DOB: [birth year], 56 y.o.   MRN: [id number]  HPI 1 month follow up  Presents for recheck after starting phentermine.  States is not working as well as it did the first time she took it.  Admits that she has not tried to work on lifestyle changes but has 17 pills left and will work to see if she can lose more weight.  Denies any adverse effects.  [facility] and [facility] are too expensive. Has been checking her blood pressure outside of the office.  BP ranges from 138/86-168/72. Doing well on current dose of Prozac. Has had urinary symptoms for the past 2 weeks.  Frequency and urgency.  Had 3 episodes of incontinence yesterday.  States that the urine just ran down her legs.  No dysuria.  No fever.  No recent history of UTI.  No obvious blood in her urine.  No nausea vomiting.  No pelvic pain.  No vaginal discharge.  Same sexual partner.  Denies any bulging or pressure in the vaginal area. Needs a refill on her pantoprazole for her GERD.  Overall controlled.  Drinks caffeinated soda.  Denies any excessive NSAID use.  No tobacco or alcohol use.    12/16/2021   11:24 AM  Depression screen PHQ 2/9  Decreased Interest 1  Down, Depressed, Hopeless 0  PHQ - 2 Score 1  Altered sleeping 1  Tired, decreased energy 1  Change in appetite 0  Feeling bad or failure about yourself  0  Trouble concentrating 0  Moving slowly or fidgety/restless 0  Suicidal thoughts 0  PHQ-9 Score 3  Difficult doing work/chores Not difficult at all      12/16/2021   11:25 AM 12/16/2021    8:53 AM 05/26/2020    1:58 PM  GAD 7 : Generalized Anxiety Score  Nervous, Anxious, on Edge 1 1 1   Control/stop worrying 2 2 2   Worry too much - different things 1 1 2   Trouble relaxing 1 1 1   Restless  1 2  Easily annoyed or irritable 1 1 2   Afraid - awful might happen 1 1 0  Total GAD 7 Score  8 10  Anxiety Difficulty Somewhat difficult Somewhat difficult Somewhat difficult       Review of Systems  Constitutional:  Positive for fatigue. Negative for chills and fever.  Respiratory:  Negative for chest tightness, shortness of breath and wheezing.   Cardiovascular:  Negative for chest pain, palpitations and leg swelling.  Gastrointestinal:  Negative for abdominal pain, constipation, diarrhea, nausea and vomiting.  Genitourinary:  Positive for enuresis and urgency. Negative for dysuria, flank pain, frequency, hematuria and pelvic pain.       Objective:   Physical Exam NAD.  Alert, oriented.  Lungs clear.  Heart regular rate rhythm.  Abdomen soft nondistended with mild epigastric area tenderness, otherwise benign.  No CVA or flank tenderness on exam. Results for orders placed or performed in visit on 12/16/21  POCT URINALYSIS DIP (CLINITEK)  Result Value Ref Range   Color, UA yellow yellow   Clarity, UA clear clear   Glucose, UA negative negative mg/dL   Bilirubin, UA negative negative   Ketones, POC UA negative negative mg/dL   Spec Grav, UA  - 1.025   Blood, UA negative negative   pH, UA 6.0 5.0 - 8.0   POC PROTEIN,UA negative negative, trace   Urobilinogen, UA 0.2 0.2 or 1.0 E.U./dL  Nitrite, UA Negative Negative   Leukocytes, UA Negative Negative  Glucose (CBG)  Result Value Ref Range   POC Glucose 102 (A) 70 - 99 mg/dl   Today's Vitals   12/16/21 0846  BP: 123/70  Pulse: 83  Temp: (!) 97.3 F (36.3 C)  SpO2: 99%  Weight: 266 lb (120.7 kg)  Height: 5\' 3"  (1.6 m)   Body mass index is 47.12 kg/m.        Assessment & Plan:   Problem List Items Addressed This Visit       Cardiovascular and Mediastinum   Essential hypertension - Primary   Relevant Medications   losartan (COZAAR) 25 MG tablet   Other Visit Diagnoses     Urinary frequency       Relevant Orders   POCT URINALYSIS DIP (CLINITEK) (Completed)   Glucose (CBG) (Completed)   Urine Culture   Gastroesophageal reflux disease without esophagitis        Relevant Medications   pantoprazole (PROTONIX) 40 MG tablet      Meds ordered this encounter  Medications   losartan (COZAAR) 25 MG tablet    Sig: Take 1 tablet (25 mg total) by mouth daily.    Dispense:  90 tablet    Refill:  0    Order Specific Question:   Supervising Provider    Answer:   Sallee Lange A [[id number]]   pantoprazole (PROTONIX) 40 MG tablet    Sig: Take 1 tablet (40 mg total) by mouth daily. Prn acid reflux    Dispense:  90 tablet    Refill:  0    Order Specific Question:   Supervising Provider    Answer:   Kathyrn Drown [[id number]]   Start losartan 25 mg daily.  Continue to monitor BP.  If either number remains over 140/90, patient to contact the office.  Discussed lifestyle factors affecting her blood pressure. Continue pantoprazole as directed.  Discussed lifestyle factors affecting her reflux symptoms. Urine culture pending. Further follow-up based on results.  Warning signs reviewed.  Call or go to ED/urgent care if worsening or new symptoms. Return in about 3 months (around 03/18/2022).

## 2021-12-17 ENCOUNTER — Encounter: Payer: Self-pay | Admitting: Nurse Practitioner

## 2021-12-21 ENCOUNTER — Other Ambulatory Visit: Payer: Self-pay | Admitting: Nurse Practitioner

## 2021-12-21 MED ORDER — AMOXICILLIN-POT CLAVULANATE 875-125 MG PO TABS: 1.0000 | ORAL_TABLET | Freq: Two times a day (BID) | ORAL | 0 refills | Status: DC

## 2021-12-22 LAB — URINE CULTURE

## 2021-12-22 LAB — SPECIMEN STATUS REPORT

## 2021-12-27 ENCOUNTER — Other Ambulatory Visit (HOSPITAL_COMMUNITY): Payer: PRIVATE HEALTH INSURANCE

## 2021-12-30 ENCOUNTER — Encounter (HOSPITAL_COMMUNITY): Payer: Self-pay

## 2021-12-30 ENCOUNTER — Ambulatory Visit (HOSPITAL_COMMUNITY): Admit: 2021-12-30 | Payer: PRIVATE HEALTH INSURANCE

## 2021-12-30 SURGERY — COLONOSCOPY WITH PROPOFOL
Anesthesia: Monitor Anesthesia Care

## 2022-03-04 ENCOUNTER — Other Ambulatory Visit: Payer: Self-pay | Admitting: Nurse Practitioner

## 2022-05-27 ENCOUNTER — Other Ambulatory Visit: Payer: Self-pay | Admitting: Nurse Practitioner

## 2022-06-08 ENCOUNTER — Other Ambulatory Visit: Payer: Self-pay | Admitting: Family Medicine

## 2022-06-28 ENCOUNTER — Other Ambulatory Visit: Payer: Self-pay | Admitting: Family Medicine

## 2022-07-24 ENCOUNTER — Encounter: Payer: Self-pay | Admitting: Family Medicine

## 2022-07-24 ENCOUNTER — Ambulatory Visit: Payer: Managed Care, Other (non HMO) | Admitting: Family Medicine

## 2022-07-24 VITALS — BP 122/84 | HR 79 | Ht 63.0 in | Wt 272.4 lb

## 2022-07-24 DIAGNOSIS — K120 Recurrent oral aphthae: Secondary | ICD-10-CM | POA: Diagnosis not present

## 2022-07-24 MED ORDER — VALACYCLOVIR HCL 1 G PO TABS
1000.0000 mg | ORAL_TABLET | Freq: Three times a day (TID) | ORAL | 0 refills | Status: DC
Start: 2022-07-24 — End: 2023-01-25

## 2022-07-24 NOTE — Progress Notes (Signed)
   Subjective:    Patient ID: Megan Parker, female    DOB: 27-Jul-1965, 57 y.o.   MRN: [id number]  HPI Patient arrives today with blister in her mouth and glands swollen. Patient states both ears feel stopped up.  Burning pain discomfort along the right side of the tongue Pain into the ears Some fatigue tiredness Swollen glands on the right side of the neck She is under a lot of stress at work because of inadequate levels of help She also has stress from taking care of her grandchild a son who has mental health problems  Review of Systems     Objective:   Physical Exam General-in no acute distress Eyes-no discharge Lungs-respiratory rate normal, CTA CV-no murmurs,RRR Extremities skin warm dry no edema Neuro grossly normal Behavior normal, alert Aphthous ulcer noted on the right side of her tongue       Assessment & Plan:  Aphthous ulcer-we will go ahead and treat with valacyclovir 3 times daily for 5 to 7 days just in case this is a form of herpes stomatitis if not improved within the next 2 weeks to notify us  Also ear pain discomfort related to the ulcer should gradually get better recommend OTC [facility] May apply small amount before eating  Patient with morbid obesity trying to watch portion and stay active  Regular follow-up visits recommended

## 2022-07-25 ENCOUNTER — Ambulatory Visit: Payer: PRIVATE HEALTH INSURANCE | Admitting: Family Medicine

## 2022-08-02 ENCOUNTER — Other Ambulatory Visit: Payer: Self-pay | Admitting: Family Medicine

## 2022-09-08 ENCOUNTER — Other Ambulatory Visit: Payer: Self-pay | Admitting: Family Medicine

## 2022-10-09 IMAGING — MR MR LUMBAR SPINE W/O CM
4 of 5 series · 30 of 48 positions shown · non-contrast
Comparison: Abdominal radiographs 05/09/2018; report from lumbar
spine radiographs 04/15/2012

CLINICAL DATA: Left-sided sciatica.  Left leg weakness.

EXAM:
MRI LUMBAR SPINE WITHOUT CONTRAST
TECHNIQUE: Multiplanar, multisequence MR imaging of the lumbar spine was
performed. No intravenous contrast was administered.

[Series 5: T1 · sagittal · 4.0mm · 0.81mm/px · 6 of 17 slices shown (1 of 2)]
[im 1/17]
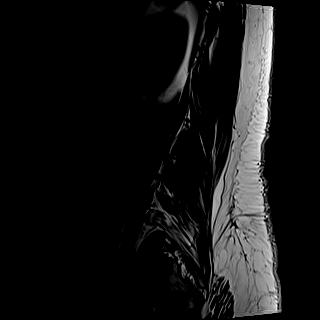
[im 4/17]
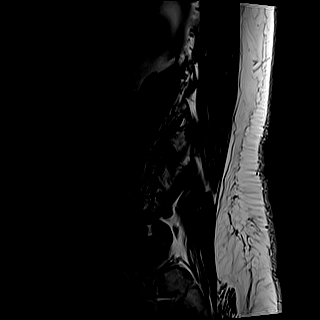
[im 7/17]
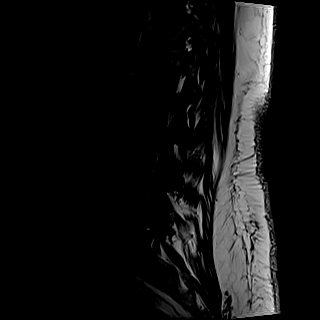
[im 10/17]
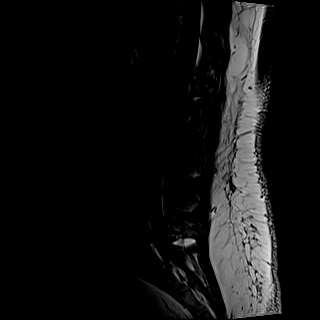
[im 13/17]
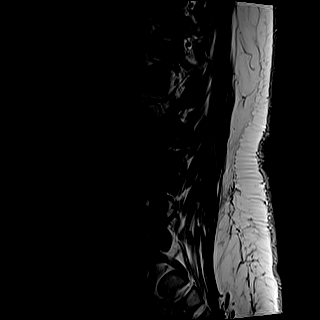
[im 17/17]
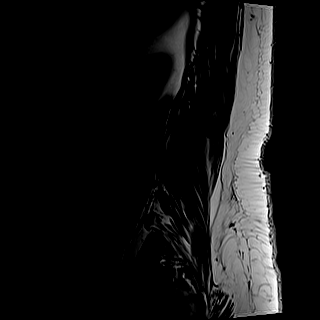

[Series 6: T2 · sagittal · 4.0mm · 0.81mm/px · 6 of 17 slices shown (1 of 2)]
[im 1/17]
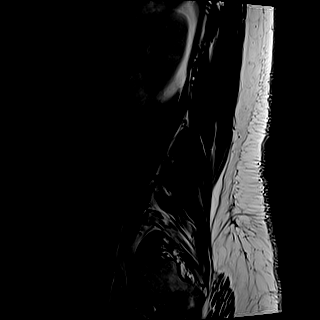
[im 4/17]
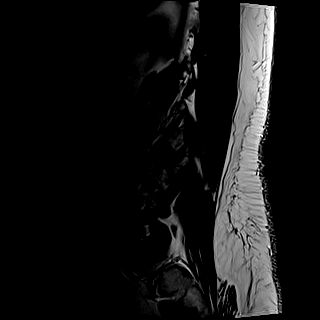
[im 7/17]
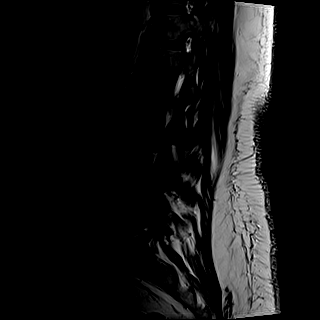
[im 10/17]
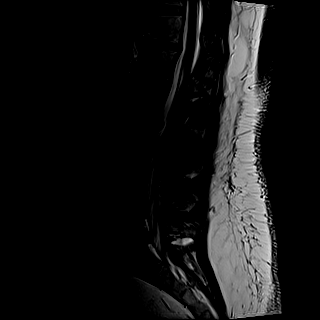
[im 13/17]
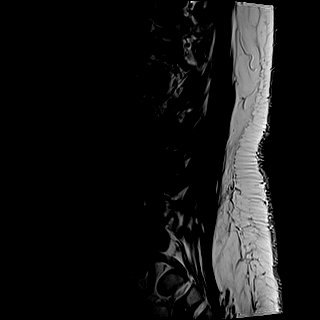
[im 17/17]
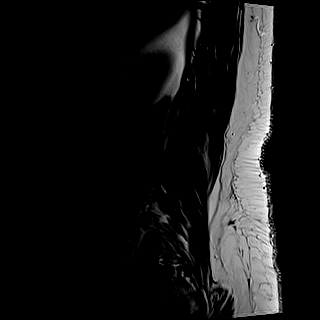

[Series 8: T2 · axial · 4.0mm · 0.62mm/px · z∈[-78,+141]mm · 9 of 41 slices shown (2 of 2)]
[im 1/41]
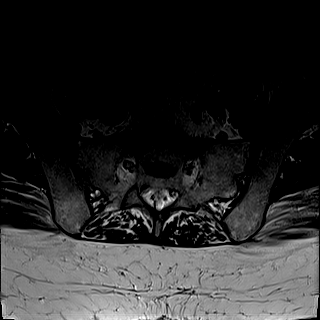
[im 6/41]
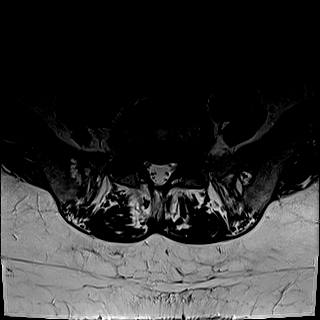
[im 12/41]
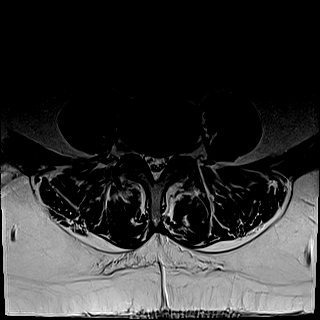
[im 18/41]
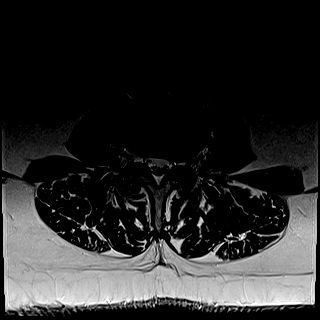
[im 21/41]
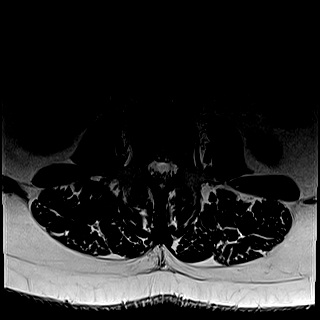
[im 23/41]
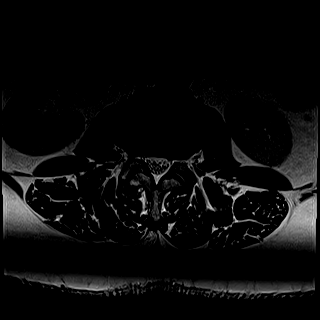
[im 29/41]
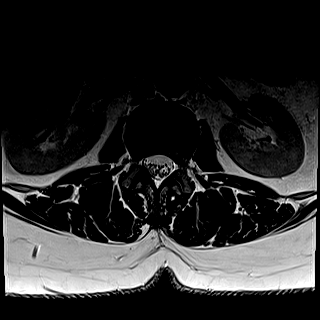
[im 35/41]
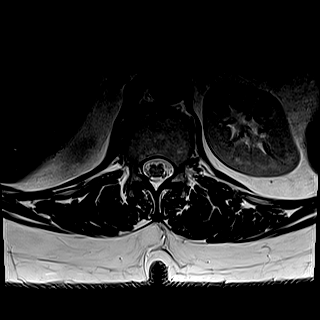
[im 41/41]
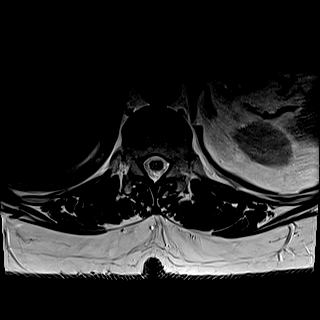

[Series 9: T1 · axial · 4.0mm · 0.39mm/px · z∈[-78,+141]mm · 9 of 41 slices shown (2 of 2)]
[im 1/41]
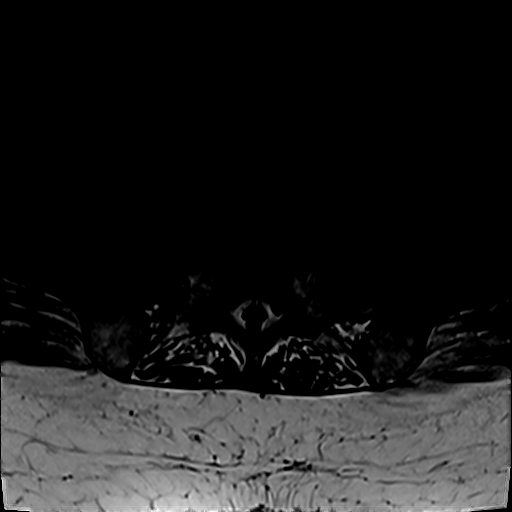
[im 6/41]
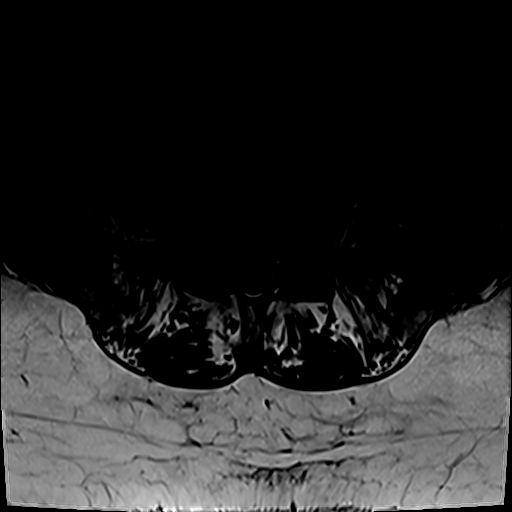
[im 12/41]
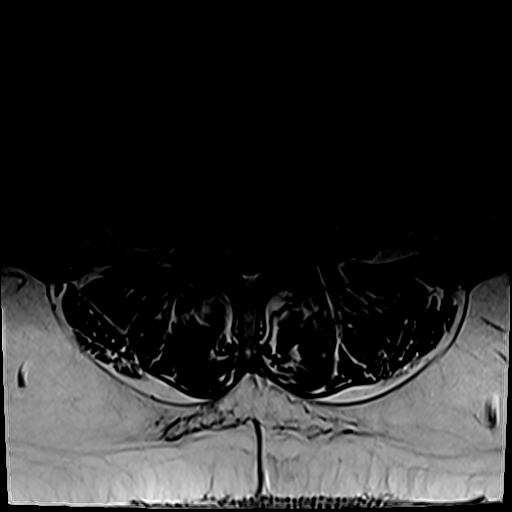
[im 18/41]
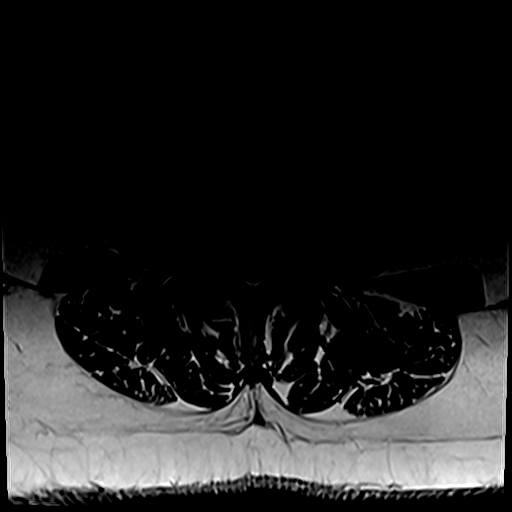
[im 21/41]
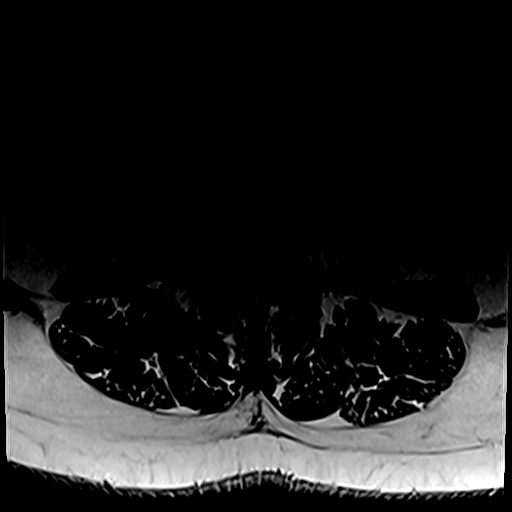
[im 23/41]
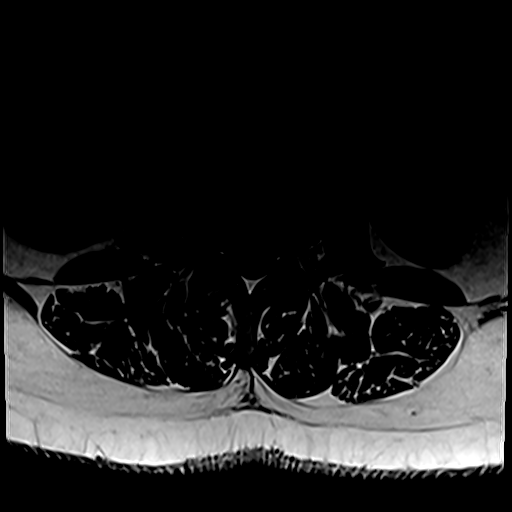
[im 29/41]
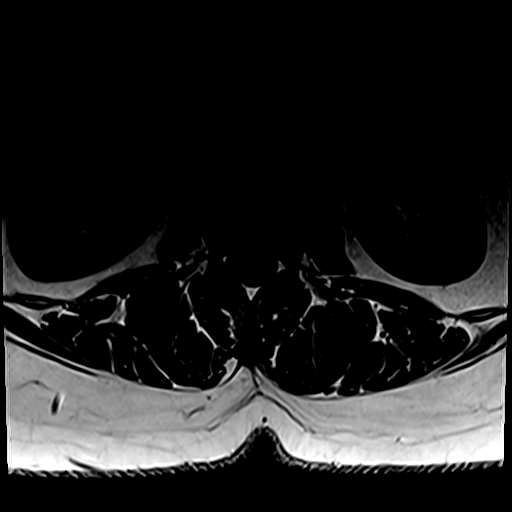
[im 35/41]
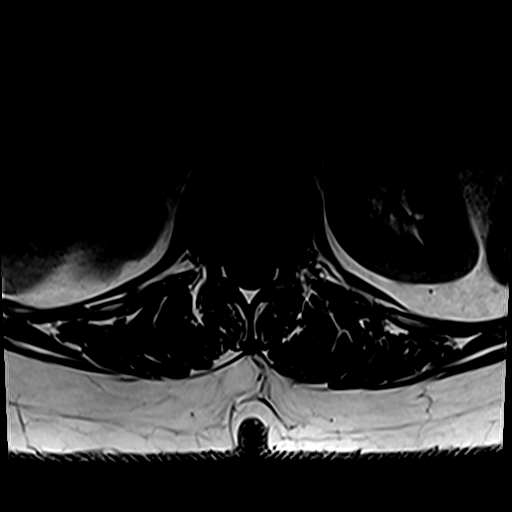
[im 41/41]
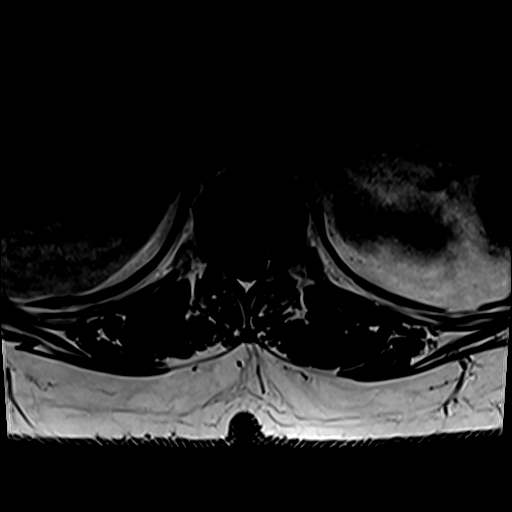

[30 of 48 positions shown; findings below may reference images not displayed]

FINDINGS: Segmentation: The lowest lumbar type non-rib-bearing vertebra is
labeled as L5.

Alignment:  No vertebral subluxation is observed.

Vertebrae: Disc desiccation is present at all lumbar levels between
L2 and S1, with loss of disc height most severe at L5-S1 and L2-3.
Type 2 degenerative endplate findings at L5-S1. Mild congenitally
short pedicles in the lumbar spine.

Conus medullaris and cauda equina: Conus extends to the L1-2 level.
Conus and cauda equina appear normal.

Paraspinal and other soft tissues: Unremarkable

Disc levels:

T12-L1: No impingement.  Mild disc bulge.

L1-2: Unremarkable

L2-3: Mild right and borderline left foraminal stenosis with mild
right and borderline left subarticular lateral recess stenosis due
to disc bulge and right greater than left degenerative facet
arthropathy.

L3-4: Mild left and borderline right foraminal stenosis due to disc
bulge and degenerative facet arthropathy.

L4-5: Moderate to severe left and mild right subarticular lateral
recess stenosis with moderate central narrowing of the thecal sac
and mild left foraminal stenosis due to central disc protrusion,
disc bulge, degenerative facet arthropathy, and a left lateral
recess disc extrusion or disc fragment which extends caudad adjacent
to the left L5 nerve roots as shown on images 10-11 of series 6 and
images 33 through 35 of series 8.

L5-S1: Moderate right and mild left foraminal stenosis due to
intervertebral spurring, disc bulge, and facet arthropathy.
IMPRESSION: 1. Lumbar spondylosis and degenerative disc disease, causing
moderate to prominent impingement at L4-5; moderate impingement at
L5-S1; and mild impingement at L2-3 and L3-4, as detailed above.
2. Of special note is the left lateral recess disc extrusion or disc
fragment extending caudad from L4-5 and impinging on the left L5
nerve roots.

## 2022-12-04 ENCOUNTER — Other Ambulatory Visit: Payer: Self-pay | Admitting: Family Medicine

## 2022-12-14 ENCOUNTER — Other Ambulatory Visit: Payer: Self-pay | Admitting: Family Medicine

## 2023-01-12 ENCOUNTER — Other Ambulatory Visit: Payer: Self-pay | Admitting: Nurse Practitioner

## 2023-01-18 ENCOUNTER — Other Ambulatory Visit: Payer: Self-pay | Admitting: Family Medicine

## 2023-01-22 ENCOUNTER — Other Ambulatory Visit: Payer: Self-pay | Admitting: *Deleted

## 2023-01-22 MED ORDER — FLUOXETINE HCL 40 MG PO CAPS: ORAL_CAPSULE | ORAL | 0 refills | Status: DC

## 2023-01-24 ENCOUNTER — Ambulatory Visit: Payer: Managed Care, Other (non HMO) | Admitting: Family Medicine

## 2023-01-25 ENCOUNTER — Encounter: Payer: Self-pay | Admitting: Nurse Practitioner

## 2023-01-25 ENCOUNTER — Ambulatory Visit (INDEPENDENT_AMBULATORY_CARE_PROVIDER_SITE_OTHER): Payer: Managed Care, Other (non HMO) | Admitting: Nurse Practitioner

## 2023-01-25 VITALS — BP 140/85 | HR 75 | Temp 97.7°F | Ht 63.0 in | Wt 272.0 lb

## 2023-01-25 DIAGNOSIS — I10 Essential (primary) hypertension: Secondary | ICD-10-CM

## 2023-01-25 DIAGNOSIS — K219 Gastro-esophageal reflux disease without esophagitis: Secondary | ICD-10-CM | POA: Diagnosis not present

## 2023-01-25 DIAGNOSIS — F418 Other specified anxiety disorders: Secondary | ICD-10-CM

## 2023-01-25 MED ORDER — PANTOPRAZOLE SODIUM 40 MG PO TBEC: DELAYED_RELEASE_TABLET | ORAL | 0 refills | Status: DC

## 2023-01-25 MED ORDER — CLONAZEPAM 0.5 MG PO TABS: ORAL_TABLET | ORAL | 0 refills | Status: DC

## 2023-01-25 MED ORDER — FLUOXETINE HCL 40 MG PO CAPS: ORAL_CAPSULE | ORAL | 5 refills | Status: DC

## 2023-01-25 NOTE — Progress Notes (Signed)
Subjective:    Patient ID: Megan Parker, female    DOB: 1965/04/23, 57 y.o.   MRN: [id number]  HPI Presents for routine follow-up of her chronic health issues.  Is not currently taking her losartan 25 mg daily.  Has been under tremendous stress lately due to family issues.  Significant financial issues.  Today is present for refill on her medications for her anxiety/depression and for her reflux.  States her reflux is well-controlled with pantoprazole daily.  Currently on fluoxetine 80 mg daily which is helping some but her stress has been overwhelming lately.  Takes a rare clonazepam only for extreme anxiety.  Non-smoker.  No vaping.  States she recently had pneumonia and continues to have slight cough and wheezing at times.  Much improved.   Review of Systems  Constitutional:  Positive for fatigue.  Respiratory:  Positive for cough and wheezing. Negative for chest tightness and shortness of breath.       01/25/2023   10:43 AM  Depression screen PHQ 2/9  Decreased Interest 2  Down, Depressed, Hopeless 1  PHQ - 2 Score 3  Altered sleeping 2  Tired, decreased energy 3  Change in appetite 3  Feeling bad or failure about yourself  3  Trouble concentrating 2  Moving slowly or fidgety/restless 2  Suicidal thoughts 0  PHQ-9 Score 18      01/25/2023   10:43 AM 12/16/2021   11:25 AM 12/16/2021    8:53 AM 05/26/2020    1:58 PM  GAD 7 : Generalized Anxiety Score  Nervous, Anxious, on Edge 0 1 1 1   Control/stop worrying 3 2 2 2   Worry too much - different things 3 1 1 2   Trouble relaxing 3 1 1 1   Restless 3  1 2   Easily annoyed or irritable 2 1 1 2   Afraid - awful might happen 0 1 1 0  Total GAD 7 Score 14  8 10   Anxiety Difficulty Somewhat difficult Somewhat difficult Somewhat difficult Somewhat difficult         Objective:   Physical Exam NAD.  Alert, oriented.  Lungs clear.  Heart regular rate rhythm.  Occasional congested cough noted.  No tachypnea.  Heart regular rate  rhythm.  Lower extremities no edema.  Abdomen soft nondistended with mild epigastric area tenderness. Today's Vitals   01/25/23 1031  BP: (!) 140/85  Pulse: 75  Temp: 97.7 F (36.5 C)  SpO2: 95%  Weight: 272 lb (123.4 kg)  Height: 5\' 3"  (1.6 m)   Body mass index is 48.18 kg/m.        Assessment & Plan:   Problem List Items Addressed This Visit       Cardiovascular and Mediastinum   Essential hypertension - Primary     Digestive   Gastroesophageal reflux disease without esophagitis   Relevant Medications   pantoprazole (PROTONIX) 40 MG tablet     Other   Depression with anxiety   Relevant Medications   FLUoxetine (PROZAC) 40 MG capsule   Meds ordered this encounter  Medications   clonazePAM (KLONOPIN) 0.5 MG tablet    Sig: Take 1/2-1 tab po BID prn anxiety    Dispense:  20 tablet    Refill:  0    Order Specific Question:   Supervising Provider    Answer:   Lilyan Punt A [[id number]]   FLUoxetine (PROZAC) 40 MG capsule    Sig: TAKE 2 CAPSULES BY MOUTH ONCE DAILY .    Dispense:  60 capsule    Refill:  5    Order Specific Question:   Supervising Provider    Answer:   Lilyan Punt A [[id number]]   pantoprazole (PROTONIX) 40 MG tablet    Sig: TAKE 1 TABLET BY MOUTH ONCE DAILY AS NEEDED FOR  ACID  REFLUX    Dispense:  90 tablet    Refill:  0    Order Specific Question:   Supervising Provider    Answer:   Babs Sciara [phone]    Encourage patient to restart losartan 25 mg daily that she has at home. Patient states the pantoprazole controls her acid reflux, continue at current dose.  Defers colon cancer screening due to cost and insurance. Continue fluoxetine as directed.  Defers any additional medication at this time.  Continue to take clonazepam only for severe anxiety. Recommend yearly screening labs. Return in about 6 months (around 07/26/2023) for physical.

## 2023-02-10 ENCOUNTER — Other Ambulatory Visit: Payer: Self-pay | Admitting: Nurse Practitioner

## 2023-03-09 ENCOUNTER — Other Ambulatory Visit: Payer: Self-pay | Admitting: Family Medicine

## 2023-03-09 NOTE — Telephone Encounter (Signed)
Copied from CRM [id number]. Topic: Clinical - Medication Refill >> Mar 09, 2023 10:14 AM Carlatta H wrote: Most Recent Primary Care Visit:  Provider: Campbell Riches  Department: [facility]  Visit Type: OFFICE VISIT  Date: 01/25/2023  Medication: Phentermine  Has the patient contacted their pharmacy? No (Agent: If no, request that the patient contact the pharmacy for the refill. If patient does not wish to contact the pharmacy document the reason why and proceed with request.) (Agent: If yes, when and what did the pharmacy advise?)  Is this the correct pharmacy for this prescription? Yes If no, delete pharmacy and type the correct one.  This is the patient's preferred pharmacy:  [facility] [address], [place] - [address] [place] [address] [place] [address] Phone: [phone] Fax: [phone]   Has the prescription been filled recently? No  Is the patient out of the medication? Yes  Has the patient been seen for an appointment in the last year OR does the patient have an upcoming appointment? Yes  Can we respond through MyChart? No  Agent: Please be advised that Rx refills may take up to 3 business days. We ask that you follow-up with your pharmacy.

## 2023-05-09 ENCOUNTER — Ambulatory Visit: Payer: Self-pay | Admitting: Family Medicine
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
## 2023-05-10 ENCOUNTER — Ambulatory Visit (INDEPENDENT_AMBULATORY_CARE_PROVIDER_SITE_OTHER): Admitting: Family Medicine

## 2023-05-10 ENCOUNTER — Encounter: Payer: Self-pay | Admitting: Family Medicine

## 2023-05-10 VITALS — BP 128/86 | HR 82 | Temp 98.3°F | Ht 63.0 in | Wt 263.0 lb

## 2023-05-10 DIAGNOSIS — R252 Cramp and spasm: Secondary | ICD-10-CM | POA: Diagnosis not present

## 2023-05-10 DIAGNOSIS — I10 Essential (primary) hypertension: Secondary | ICD-10-CM | POA: Diagnosis not present

## 2023-05-10 DIAGNOSIS — J019 Acute sinusitis, unspecified: Secondary | ICD-10-CM

## 2023-05-10 DIAGNOSIS — R400 Somnolence: Secondary | ICD-10-CM

## 2023-05-10 MED ORDER — AMOXICILLIN-POT CLAVULANATE 875-125 MG PO TABS: 1.0000 | ORAL_TABLET | Freq: Two times a day (BID) | ORAL | 0 refills | Status: DC

## 2023-05-10 MED ORDER — LOSARTAN POTASSIUM 25 MG PO TABS: 25.0000 mg | ORAL_TABLET | Freq: Every day | ORAL | 1 refills | Status: DC

## 2023-05-10 MED ORDER — PHENTERMINE HCL 37.5 MG PO TABS: 37.5000 mg | ORAL_TABLET | Freq: Every day | ORAL | 2 refills | Status: DC

## 2023-05-10 MED ORDER — ALBUTEROL SULFATE HFA 108 (90 BASE) MCG/ACT IN AERS: 2.0000 | INHALATION_SPRAY | Freq: Four times a day (QID) | RESPIRATORY_TRACT | 0 refills | Status: AC | PRN

## 2023-05-10 NOTE — Progress Notes (Signed)
   Subjective:    Patient ID: Megan Parker, female    DOB: 10/23/1965, 58 y.o.   MRN: [id number]  HPI Patient with congestion head congestion drainage coughing chest congestion denies high fever chills sweats denies vomiting.  Does relate wheezing.  Feels short of breath with activity.  Patient also battling morbid obesity she has had this for years she is trying to watch her diet but it has been difficult for her to lose weight     Review of Systems     Objective:   Physical Exam Chest congestion coughing no rales or rhonchi does have scattered expiratory wheezes eardrums normal throat is normal sinus moderate tenderness       Assessment & Plan:  1. Essential hypertension (Primary) HTN- patient seen for follow-up regarding HTN.   Diet, medication compliance, appropriate labs and refills were completed.   Importance of keeping blood pressure under good control to lessen the risk of complications discussed Regular follow-up visits discussed  - Basic metabolic panel - Magnesium  2. Morbid obesity (HCC) Portion control regular physical activity Patient would like to do phenteramine again I told her that we will utilize this no longer than 2 months if any side effects stop - TSH + free T4  3. Acute rhinosinusitis Antibiotics recommended and sent in Allergy medicine recommended and sent in  4. Muscle cramps Patient relates muscle cramps discomfort check magnesium check thyroid function check potassium - Magnesium  5. Daytime somnolence I have advised patient she should consider a sleep study patient defers currently she will think about it and let us know - TSH + free T4

## 2023-05-11 ENCOUNTER — Other Ambulatory Visit: Payer: Self-pay

## 2023-05-11 ENCOUNTER — Other Ambulatory Visit: Payer: Self-pay | Admitting: Family Medicine

## 2023-05-11 DIAGNOSIS — R400 Somnolence: Secondary | ICD-10-CM

## 2023-05-11 DIAGNOSIS — R252 Cramp and spasm: Secondary | ICD-10-CM

## 2023-05-11 DIAGNOSIS — I10 Essential (primary) hypertension: Secondary | ICD-10-CM

## 2023-05-12 LAB — BASIC METABOLIC PANEL
BUN: 15 mg/dL (ref 7–25)
CO2: 28 mmol/L (ref 20–32)
Calcium: 9.5 mg/dL (ref 8.6–10.4)
Chloride: 101 mmol/L (ref 98–110)
Creat: 0.88 mg/dL (ref 0.50–1.03)
Glucose, Bld: 94 mg/dL (ref 65–99)
Potassium: 4.3 mmol/L (ref 3.5–5.3)
Sodium: 139 mmol/L (ref 135–146)
eGFR: 77 mL/min/{1.73_m2} (ref >=60)

## 2023-05-12 LAB — TSH+FREE T4
Free T4: 1.2 ng/dL (ref 0.8–1.8)
TSH: 1.42 m[IU]/L (ref 0.40–4.50)

## 2023-05-12 LAB — MAGNESIUM: Magnesium: 2.2 mg/dL (ref 1.5–2.5)

## 2023-05-13 ENCOUNTER — Encounter: Payer: Self-pay | Admitting: Family Medicine

## 2023-06-04 ENCOUNTER — Ambulatory Visit: Admitting: Physician Assistant

## 2023-06-04 ENCOUNTER — Ambulatory Visit: Payer: Self-pay

## 2023-06-04 ENCOUNTER — Encounter: Payer: Self-pay | Admitting: Physician Assistant

## 2023-06-04 VITALS — BP 152/86 | HR 90 | Temp 97.8°F | Ht 63.0 in | Wt 273.0 lb

## 2023-06-04 DIAGNOSIS — I10 Essential (primary) hypertension: Secondary | ICD-10-CM | POA: Diagnosis not present

## 2023-06-04 MED ORDER — LOSARTAN POTASSIUM 50 MG PO TABS: 50.0000 mg | ORAL_TABLET | Freq: Every day | ORAL | 1 refills | Status: DC

## 2023-06-04 NOTE — Telephone Encounter (Signed)
 Chief Complaint: Blurred Vision, High BP reading, headache Symptoms: high BP 160/98, blurred vision in both eyes, headache Frequency: blurred vision started at 6:00 AM gradually Pertinent Negatives: Patient denies CP, SOB Disposition: [] ED /[] Urgent Care (no appt availability in office) / [] Appointment(In office/virtual)/ []  [place] [facility]/ [] Home Care/ [x] Refused Recommended Disposition /[] [place] [facility]/ []  Follow-up with PCP Additional Notes: patient called with concerns for blurred vision that started at 6:00 AM, high BP reading-160/98 and small headache. Patient reports blurred vision is present in both eyes and started gradually at 6:00 AM this morning. Patient states she can't see anything due to how blurry her vision in. Patient endorses "feeling off." Patient's bp was checked in office 160/98. Per protocol, patient is recommended to ED. Patient refuses stating she is unable to pay the copay for the ED. Patient was wanting to be seen in the office. Patient is needing a phone call from staff. Patient verbalized understanding and will be awaiting a phone call from staff. All questions answered.    Copied from CRM [id number]. Topic: Clinical - Red Word Triage >> Jun 04, 2023  9:51 AM Alessandra Bevels wrote: Red Word that prompted transfer to Nurse Triage: Patient is calling to report that her vision is very blurry with light headedness and anxiety. She took her BP- 160/98 Reason for Disposition  [1] Systolic BP  >= 160 OR Diastolic >= 100 AND [2] cardiac (e.g., breathing difficulty, chest pain) or neurologic symptoms (e.g., new-onset blurred or double vision, unsteady gait)  Patient sounds very sick or weak to the triager  [1] Blurred vision or visual changes AND [2] present now AND [3] sudden onset or new (e.g., minutes, hours, days)  (Exception: Seeing floaters / black specks OR previously diagnosed migraine headaches with same symptoms.)  Answer Assessment - Initial Assessment  Questions 1. LOCATION: "Where does it hurt?"      Frontal headache 2. ONSET: "When did the headache start?" (Minutes, hours or days)      This morning 3. PATTERN: "Does the pain come and go, or has it been constant since it started?"     Comes and goes 4. SEVERITY: "How bad is the pain?" and "What does it keep you from doing?"  (e.g., Scale 1-10; mild, moderate, or severe)   - MILD (1-3): doesn't interfere with normal activities    - MODERATE (4-7): interferes with normal activities or awakens from sleep    - SEVERE (8-10): excruciating pain, unable to do any normal activities        mild 5. RECURRENT SYMPTOM: "Have you ever had headaches before?" If Yes, ask: "When was the last time?" and "What happened that time?"      no 6. CAUSE: "What do you think is causing the headache?"     unsure 7. MIGRAINE: "Have you been diagnosed with migraine headaches?" If Yes, ask: "Is this headache similar?"      no 8. HEAD INJURY: "Has there been any recent injury to the head?"      no 9. OTHER SYMPTOMS: "Do you have any other symptoms?" (fever, stiff neck, eye pain, sore throat, cold symptoms)     Blurred vision  Answer Assessment - Initial Assessment Questions 1. DESCRIPTION: "How has your vision changed?" (e.g., complete vision loss, blurred vision, double vision, floaters, etc.)     Blurred vision 2. LOCATION: "One or both eyes?" If one, ask: "Which eye?"     both 3. SEVERITY: "Can you see anything?" If Yes, ask: "What  can you see?" (e.g., fine print)     No-everything is blurry 4. ONSET: "When did this begin?" "Did it start suddenly or has this been gradual?"     6:00 AM-gradual 5. PATTERN: "Does this come and go, or has it been constant since it started?"     constant 6. PAIN: "Is there any pain in your eye(s)?"  (Scale 1-10; or mild, moderate, severe)   - NONE (0): No pain.   - MILD (1-3): Doesn't interfere with normal activities.   - MODERATE (4-7): Interferes with normal activities or  awakens from sleep.    - SEVERE (8-10): Excruciating pain, unable to do any normal activities.     no 7. CONTACTS-GLASSES: "Do you wear contacts or glasses?"     glasses 8. CAUSE: "What do you think is causing this visual problem?"     unsure 9. OTHER SYMPTOMS: "Do you have any other symptoms?" (e.g., confusion, headache, arm or leg weakness, speech problems)     Patient reports "feeling off" feels like she is seeing double.  Answer Assessment - Initial Assessment Questions 1. BLOOD PRESSURE: "What is the blood pressure?" "Did you take at least two measurements 5 minutes apart?"     160/98 2. ONSET: "When did you take your blood pressure?"     Prior to calling 3. HOW: "How did you take your blood pressure?" (e.g., automatic home BP monitor, visiting nurse)     Taken in office 4. HISTORY: "Do you have a history of high blood pressure?"     Yes 5. MEDICINES: "Are you taking any medicines for blood pressure?" "Have you missed any doses recently?"     Losartan 6. OTHER SYMPTOMS: "Do you have any symptoms?" (e.g., blurred vision, chest pain, difficulty breathing, headache, weakness)     Headache, blurred vision  Protocols used: Headache-A-AH, Vision Loss or Change-A-AH, Blood Pressure - High-A-AH

## 2023-06-04 NOTE — Progress Notes (Signed)
 Established Patient Office Visit  Subjective   Patient ID: Megan Parker, female    DOB: 02/17/1966  Age: 58 y.o. MRN: [id number]  Chief Complaint  Patient presents with   Hypertension    160/98 at home.  Blurry vision and headache    Patient presents today for concerns of elevated blood pressure. She reports reading on 160/98 at work this morning. She endorses bilateral blurry vision and slight headache. She denies vision loss. She denies chest pain or shortness of breath. She relates medication compliance daily. She has had recent lab work as of March 2025.      Review of Systems  Eyes:  Positive for blurred vision. Negative for double vision and pain.  Respiratory:  Negative for shortness of breath.   Cardiovascular:  Negative for chest pain and palpitations.  Neurological:  Positive for headaches. Negative for dizziness and loss of consciousness.      Objective:     BP (!) 152/86   Pulse 90   Temp 97.8 F (36.6 C)   Ht 5\' 3"  (1.6 m)   Wt 273 lb (123.8 kg)   SpO2 96%   BMI 48.36 kg/m    Physical Exam Constitutional:      Appearance: Normal appearance.  HENT:     Head: Normocephalic.     Mouth/Throat:     Mouth: Mucous membranes are moist.     Pharynx: Oropharynx is clear.  Eyes:     General: Lids are normal. Vision grossly intact. No visual field deficit.    Extraocular Movements: Extraocular movements intact.     Right eye: No nystagmus.     Left eye: No nystagmus.     Conjunctiva/sclera: Conjunctivae normal.     Right eye: Right conjunctiva is not injected.     Left eye: Left conjunctiva is not injected.     Pupils: Pupils are equal, round, and reactive to light.  Cardiovascular:     Rate and Rhythm: Normal rate and regular rhythm.     Heart sounds: Normal heart sounds. No murmur heard. Pulmonary:     Effort: Pulmonary effort is normal.     Breath sounds: Normal breath sounds.  Skin:    General: Skin is warm and dry.  Neurological:     General: No  focal deficit present.     Mental Status: She is alert and oriented to person, place, and time.  Psychiatric:        Mood and Affect: Mood normal.        Behavior: Behavior normal.      No results found for any visits on 06/04/23.  The ASCVD Risk score (Arnett DK, et al., 2019) failed to calculate for the following reasons:   Cannot find a previous HDL lab   Cannot find a previous total cholesterol lab    Assessment & Plan:   Return in 7 weeks (on 07/25/2023) for previously scheduled appointment or sooner as needed.   Essential hypertension Assessment & Plan: 148/88, 152/86 Uncontrolled. Visual fields and EOM intact today.  Continue current medications. Increasing losartan to 50mg  daily.  Patient instructed to take additional dose of 25mg  losartan today following this appointment. Discussed DASH diet and dietary sodium restrictions.  Continue dietary efforts and physical activity. Warning signs reviewed. Patient to follow up for worsening vision changes, chest pain, shortness of breath, dizziness, or syncope.  Recent lab work and previous visit notes reviewed today.     Other orders -     Losartan  Potassium; Take 1 tablet (50 mg total) by mouth daily.  Dispense: 90 tablet; Refill: 1    Uvaldo Rybacki, PA-C

## 2023-06-04 NOTE — Progress Notes (Signed)
 Marland Kitchen

## 2023-06-04 NOTE — Telephone Encounter (Signed)
 Left message to return call to offer patient appointment today at 2pm with PA Jearlean Mince

## 2023-06-04 NOTE — Telephone Encounter (Signed)
Duplicate- see other message

## 2023-06-04 NOTE — Assessment & Plan Note (Addendum)
 148/88, 152/86 Uncontrolled. Visual fields and EOM intact today.  Continue current medications. Increasing losartan to 50mg  daily.  Patient instructed to take additional dose of 25mg  losartan today following this appointment. Discussed DASH diet and dietary sodium restrictions.  Continue dietary efforts and physical activity. Warning signs reviewed. Patient to follow up for worsening vision changes, chest pain, shortness of breath, dizziness, or syncope.  Recent lab work and previous visit notes reviewed today.

## 2023-06-04 NOTE — Telephone Encounter (Signed)
 Patient scheduled office visit today with Kempner PA

## 2023-06-04 NOTE — Telephone Encounter (Signed)
 This RN called patient in regard to a return call she missed from the [facility] to offer patient appt with PA Courtney. This RN connected patient with [name] at [facility] to confirm and make appt - regarding refusal of ED dispo.  Copied from CRM [id number]. Topic: General - Other >> Jun 04, 2023  1:19 PM Megan Parker wrote: Reason for CRM: Patient returned call regarding missed call for elevated blood pressure (160/90). Patient reports blurry vision and lightheadedness. This is a follow-up on previous triage message.

## 2023-06-04 NOTE — Telephone Encounter (Signed)
 Megan Flax, NP     Megan Parker agrees to see her this afternoon.

## 2023-06-05 ENCOUNTER — Ambulatory Visit: Payer: Self-pay

## 2023-06-05 NOTE — Telephone Encounter (Addendum)
 2nd call attempt, to work number [phone] as requested, went unanswered X 12 rings and no mailbox to leave message.   1st call attempt, to work number [phone] as requested, went unanswered and no mailbox to leave message.   Copied from CRM [id number]. Topic: Clinical - Medical Advice >> Jun 05, 2023  9:49 AM Crispin Dolphin wrote: Reason for CRM: Patient called. States she was seen yesterday. Increased bp medication. She took increased dose this morning at 6 AM and took her blood pressure about 9:30 and it was 178/100. She would like the provider to know and to know what she needs to do. She was not active or stressed before taking it Have to call her at work due to phone messed up. Work phone : [phone] Thank You

## 2023-06-05 NOTE — Telephone Encounter (Signed)
 3rd attempt to return her call without success.   No voicemail available.  Per policy I'm forwarding this to [facility].

## 2023-06-06 ENCOUNTER — Other Ambulatory Visit: Payer: Self-pay

## 2023-06-06 ENCOUNTER — Telehealth: Payer: Self-pay

## 2023-06-06 ENCOUNTER — Ambulatory Visit: Payer: Self-pay | Admitting: *Deleted

## 2023-06-06 DIAGNOSIS — I10 Essential (primary) hypertension: Secondary | ICD-10-CM

## 2023-06-06 MED ORDER — LOSARTAN POTASSIUM 100 MG PO TABS: 100.0000 mg | ORAL_TABLET | Freq: Every day | ORAL | 4 refills | Status: DC

## 2023-06-06 NOTE — Telephone Encounter (Signed)
Left message for patient to return the call for additional details and recommendations.   

## 2023-06-06 NOTE — Telephone Encounter (Signed)
 Please advise which labs will need to be ordered after starting losartan 100 in 7 to 10 days

## 2023-06-06 NOTE — Telephone Encounter (Signed)
 Nurses I would recommend changing her losartan New dose 100 mg daily, may send in 30 tablets with 4 refills patient to do follow-up blood work within 7 to 10 days of this change I would also recommend a blood pressure check with myself or Courtney in the next 3 weeks Also stop phenteramine for now

## 2023-06-06 NOTE — Telephone Encounter (Signed)
  Chief Complaint: elevated BP after starting increase dose of cozaar. Requesting to establish new patient appt at other practice since PCP will be leaving current practice. Symptoms: BP 178/100 HR 78. Mild sx headache, blurred vision comes and goes. Has seen PCP and cozaar dose increased. Mild dizziness with standing fast.  Frequency: today  Pertinent Negatives: Patient denies chest pain no difficulty breathing no weakness on either side of body. No N/T no slurred speech  Disposition: [] ED /[] Urgent Care (no appt availability in office) / [] Appointment(In office/virtual)/ []  [place] [facility]/ [] Home Care/ [] Refused Recommended Disposition /[] [facility]/ [x]  Follow-up with PCP Additional Notes:   Patient last OV 06/04/23 and requesting PCP to be notified. Patient at work and would like to see if PCP wants to adjust medication again. Please advise.  Recommended increase water intake . Recheck BP and if sx worsen go to ED. Please advise.        Copied from CRM [id number]. Topic: Clinical - Red Word Triage >> Jun 06, 2023 10:46 AM Ivette P wrote: [facility] that prompted transfer to Nurse Triage: 180/110 - losartan (COZAAR) 50 MG tablet Reason for Disposition  Systolic BP  >= 180 OR Diastolic >= 110  Answer Assessment - Initial Assessment Questions 1. BLOOD PRESSURE: "What is the blood pressure?" "Did you take at least two measurements 5 minutes apart?"     BP 178/100 HR 78 while at work 2. ONSET: "When did you take your blood pressure?"     Now  3. HOW: "How did you take your blood pressure?" (e.g., automatic home BP monitor, visiting nurse)     At work now nurse will check  4. HISTORY: "Do you have a history of high blood pressure?"     HTN 5. MEDICINES: "Are you taking any medicines for blood pressure?" "Have you missed any doses recently?"     Yes just adjusted to increase cozaar 6. OTHER SYMPTOMS: "Do you have any symptoms?" (e.g., blurred vision, chest pain, difficulty  breathing, headache, weakness)     Dull headaches, blurry vision at times . Mild sx at this time 7. PREGNANCY: "Is there any chance you are pregnant?" "When was your last menstrual period?"     na  Protocols used: Blood Pressure - High-A-AH

## 2023-06-06 NOTE — Telephone Encounter (Signed)
Met 7 

## 2023-06-06 NOTE — Telephone Encounter (Signed)
 Spoke with patient and informed per drs notes and recommendations.

## 2023-06-12 ENCOUNTER — Other Ambulatory Visit: Payer: Self-pay | Admitting: Nurse Practitioner

## 2023-06-23 LAB — BASIC METABOLIC PANEL WITH GFR
BUN/Creatinine Ratio: 17 (ref 9–23)
BUN: 13 mg/dL (ref 6–24)
CO2: 22 mmol/L (ref 20–29)
Calcium: 9.7 mg/dL (ref 8.7–10.2)
Chloride: 102 mmol/L (ref 96–106)
Creatinine, Ser: 0.76 mg/dL (ref 0.57–1.00)
Glucose: 97 mg/dL (ref 70–99)
Potassium: 4.7 mmol/L (ref 3.5–5.2)
Sodium: 141 mmol/L (ref 134–144)
eGFR: 91 mL/min/{1.73_m2} (ref >59)

## 2023-06-24 ENCOUNTER — Encounter: Payer: Self-pay | Admitting: Family Medicine

## 2023-06-26 ENCOUNTER — Encounter: Payer: Self-pay | Admitting: Family Medicine

## 2023-06-26 ENCOUNTER — Ambulatory Visit: Admitting: Family Medicine

## 2023-06-26 VITALS — BP 113/76 | HR 79 | Temp 98.1°F | Ht 63.0 in | Wt 267.0 lb

## 2023-06-26 DIAGNOSIS — J019 Acute sinusitis, unspecified: Secondary | ICD-10-CM

## 2023-06-26 DIAGNOSIS — F439 Reaction to severe stress, unspecified: Secondary | ICD-10-CM

## 2023-06-26 DIAGNOSIS — F411 Generalized anxiety disorder: Secondary | ICD-10-CM

## 2023-06-26 DIAGNOSIS — I10 Essential (primary) hypertension: Secondary | ICD-10-CM | POA: Diagnosis not present

## 2023-06-26 MED ORDER — CLONAZEPAM 0.5 MG PO TABS: ORAL_TABLET | ORAL | 1 refills | Status: DC

## 2023-06-26 MED ORDER — AMOXICILLIN-POT CLAVULANATE 875-125 MG PO TABS
1.0000 | ORAL_TABLET | Freq: Two times a day (BID) | ORAL | 0 refills | Status: DC
Start: 2023-06-26 — End: 2023-07-11

## 2023-06-26 MED ORDER — LOSARTAN POTASSIUM 100 MG PO TABS: 100.0000 mg | ORAL_TABLET | Freq: Every day | ORAL | 4 refills | Status: DC

## 2023-06-26 MED ORDER — FLUOXETINE HCL 40 MG PO CAPS: ORAL_CAPSULE | ORAL | 5 refills | Status: DC

## 2023-06-26 NOTE — Progress Notes (Signed)
   Subjective:    Patient ID: Megan Parker, female    DOB: 1965/07/04, 58 y.o.   MRN: [id number]  HPI  Productive cough, greenish mucus,  sore throat,  headache,  ear ache x's 1 week  Discussed the use of AI scribe software for clinical note transcription with the patient, who gave verbal consent to proceed.  History of Present Illness   Megan Parker is a 58 year old female with hypertension and anxiety who presents with upper respiratory symptoms and stress management concerns.  She has been experiencing upper respiratory symptoms including congestion, headache, ear pain, sore throat, and difficulty breathing. The symptoms are primarily affecting her head and chest, accompanied by body aches. She performed a COVID test this morning, which was negative. She mentions having cold sores in her mouth and cracked lips earlier this week. She has been coughing up thick, green mucus, which causes choking sensations. She did not work yesterday and stayed in bed due to her symptoms.  Her hypertension is well-controlled with medication, with recent blood pressure readings of 113/76 and 110/68. She is taking 100 mg of her blood pressure medication, which she reports has been effective.  She has been on fluoxetine  for anxiety and reports feeling stressed and anxious, with everything getting under her skin for no reason. She copes by sleeping a lot and isolating herself in her bedroom. She also takes clonazepam  as needed for anxiety, but has been out of it for a while. She describes feeling overwhelmed and crying at work without knowing why. She is the primary caregiver for her husband, who has significant health issues, and her grandson, which adds to her stress.  Her social history includes living with her husband, who has mobility issues and recently experienced a severe health crisis involving a 'sugar coma' and pneumonia. She is also raising her 52 year old grandson and her son, who has been diagnosed with  paranoia, has moved back in with her. She reports financial strain and lack of external support, as her grandson does not qualify for additional financial assistance beyond [place].       Review of Systems     Objective:   Physical Exam   Gen-NAD not toxic TMS-normal bilateral T- normal no redness Chest-CTA respiratory rate normal no crackles CV RRR no murmur Skin-warm dry Neuro-grossly normal Moderate sinus tenderness frontal and maxillary     Assessment & Plan:  Assessment and Plan    Acute sinusitis Symptoms consistent with sinusitis, likely viral or bacterial. COVID test negative, no pneumonia. - Prescribe antibiotics. - Advise rest through Wednesday. - Provide work note for Monday, Tuesday, and Wednesday. - Advise wearing a mask if returning to work before Thursday.  Anxiety disorder Persistent anxiety exacerbated by stressors. Currently on fluoxetine . Clonazepam  used for acute episodes. Discussed counseling benefits and advised against daily clonazepam  use. - Continue fluoxetine  daily. - Prescribe clonazepam  for acute anxiety, advising use 5-10 times per month. - Encourage consideration of counseling for stress management. - Send refill for clonazepam  to pharmacy.  Hypertension Blood pressure well-controlled with current medication. No hypotension symptoms. Discussed dose adjustment if hypotension occurs. - Continue current antihypertensive medication at 100 mg daily. - Monitor blood pressure regularly. - Reassess if blood pressure drops to 90/60 or if symptoms of hypotension occur.      Sinusitis Antibiotic prescribed Update on medicines Lab work later this year Follow-up visit in September or October

## 2023-06-27 ENCOUNTER — Ambulatory Visit: Admitting: Physician Assistant

## 2023-07-10 NOTE — Progress Notes (Signed)
 New Patient Office Visit  Subjective   Patient ID: Megan Parker, female    DOB: 01-03-1966  Age: 58 y.o. MRN: [id number]  CC:  Chief Complaint  Patient presents with   Establish Care   Hypertension    Has hypertension and has been trying to find right meds with previous pcp    HPI Megan Parker is 58 yrs old female presents 07/11/2023  to establish care no concerns   58 year old female with a past medical history of asthma who presents with a 10-day history of cough productive of yellow sputum. She denies hemoptysis or shortness of breath but reports mild wheezing, which she attributes to her asthma. No known fever or chills. The patient has a BMI of 48.36. She was previously taking phentermine  for weight loss but discontinued it due to elevated blood pressure. She expresses motivation to lose weight through dietary changes and increased physical activity. She is seeking guidance on initiating a sustainable weight loss plan.  Depression  She has a diagnosis of depression and has been on Prozac  40 mg for the last 5 years  She reports excellent compliance with treatment. She is not having side effects.  She reports excellent tolerance of treatment. Current symptoms include: anhedonia, depressed mood, difficulty concentrating, fatigue, hopelessness, and impaired memory She feels she is Worse since last visit. Plan to add Rexulti     07/11/2023    8:22 AM 06/26/2023   11:45 AM 06/04/2023    2:35 PM  Depression screen PHQ 2/9  Decreased Interest 2 3 1   Down, Depressed, Hopeless 3 3 0  PHQ - 2 Score 5 6 1   Altered sleeping 3 2 1   Tired, decreased energy 3 3 2   Change in appetite 3 2 0  Feeling bad or failure about yourself  2 2 0  Trouble concentrating 3 3 0  Moving slowly or fidgety/restless 1 0 3  Suicidal thoughts 0 0 0  PHQ-9 Score 20 18 7   Difficult doing work/chores Somewhat difficult Very difficult Not difficult at all    Anxiety, Follow-up  She has a diagnosis of anxiety  and has been on Prozac  40 mg and Klonopin  PRN for over 1-yr " and only taking as needed, IN I do not like the way it makes me feel I know it is addicted I would like to get weaned off of it" She reports excellent compliance with treatment. She is not having side effects.  She reports excellent tolerance of treatment. She feels her anxiety is severe and Worse since last visit. Symptoms: Yes chest pain Yes difficulty concentrating  No dizziness Yes fatigue  Yes feelings of losing control Yes insomnia  Yes irritable Yes palpitations  No panic attacks Yes racing thoughts  No shortness of breath No sweating  No tremors/shakes    GAD-7 Results    07/11/2023    8:23 AM 06/26/2023   11:45 AM 06/04/2023    2:35 PM  GAD-7 Generalized Anxiety Disorder Screening Parker  1. Feeling Nervous, Anxious, or on Edge 2 3 0  2. Not Being Able to Stop or Control Worrying 3 3 0  3. Worrying Too Much About Different Things 3 3 0  4. Trouble Relaxing 3 2 0  5. Being So Restless it's Hard To Sit Still 3 0 0  6. Becoming Easily Annoyed or Irritable 2 2 0  7. Feeling Afraid As If Something Awful Might Happen 2 2 0  Total GAD-7 Score 18 15 0  Difficulty  At Work, Home, or Getting  Along With Others? Somewhat difficult Very difficult Not difficult at all    PHQ-9 Scores    07/11/2023    8:22 AM 06/26/2023   11:45 AM 06/04/2023    2:35 PM  PHQ9 SCORE ONLY  PHQ-9 Total Score 20 18 7     Hypertension  BP Readings from Last 3 Encounters:  07/11/23 120/68  06/26/23 113/76  06/04/23 (!) 152/86   Wt Readings from Last 3 Encounters:  07/11/23 273 lb (123.8 kg)  06/26/23 267 lb (121.1 kg)  06/04/23 273 lb (123.8 kg)     She has a dx of HTN and current on Losartan  100 mg daily BP at that visit was 113/76.   She reports excellent compliance with treatment. She is not having side effects.  She is following a Regular diet. She is not exercising. She does not smoke. " Quit smoking 1-yr ago, 1 pack day since she  58 yrs old Use of agents associated with hypertension: none.   Outside blood pressures are not being monitored. Symptoms: No chest pain No chest pressure  No palpitations No syncope  No dyspnea No orthopnea  No paroxysmal nocturnal dyspnea No lower extremity edema   Pertinent labs Lab Results  Component Value Date   CHOL 169 04/30/2020   HDL 70 04/30/2020   LDLCALC 79 04/30/2020   TRIG 113 04/30/2020   CHOLHDL 2.4 04/30/2020   Lab Results  Component Value Date   NA 141 06/22/2023   K 4.7 06/22/2023   CREATININE 0.76 06/22/2023   EGFR 91 06/22/2023   GLUCOSE 97 06/22/2023   TSH 1.42 05/11/2023     The ASCVD Risk score (Arnett DK, et al., 2019) failed to calculate for the following reasons:   Cannot find a previous HDL lab   Cannot find a previous total cholesterol lab  GERD, Follow up: The patient has a dx of GERD  and currently on pantoprazole  40 mg daily  She reports excellent compliance with treatment. She is not having side effects. . She IS experiencing . She is NOT experiencing belching and eructation, deep pressure at base of neck, difficulty swallowing, or fullness after meals         Plan to continue Pantoprazole   Asthma well-controlled asthma, managed with Symbicort and Ventolin . Report no recent asthma symptoms, with good control over their condition. Medication adherence is consistent, and there are no recent exacerbations or side effects    Quit smoking 1 yrs ago was smoking 1-pack since 21 yrs   Outpatient Encounter Medications as of 07/11/2023  Medication Sig   albuterol  (VENTOLIN  HFA) 108 (90 Base) MCG/ACT inhaler Inhale 2 puffs into the lungs every 6 (six) hours as needed for wheezing or shortness of breath.   azithromycin  (ZITHROMAX ) 250 MG tablet Take 2 tablets on day 1, then 1 tablet daily on days 2 through 5   b complex vitamins capsule Take 1 capsule by mouth daily.   brexpiprazole (REXULTI) 0.25 MG TABS tablet Take 1 tablet (0.25 mg total) by  mouth daily.   clonazePAM  (KLONOPIN ) 0.5 MG tablet Take 1/2-1 tab po BID prn anxiety   diclofenac  (VOLTAREN ) 75 MG EC tablet Take 1 tablet by mouth twice daily   FLUoxetine  (PROZAC ) 40 MG capsule TAKE 2 CAPSULES BY MOUTH ONCE DAILY .   losartan  (COZAAR ) 100 MG tablet Take 1 tablet (100 mg total) by mouth daily.   pantoprazole  (PROTONIX ) 40 MG tablet TAKE 1 TABLET BY MOUTH ONCE DAILY AS NEEDED  FOR ACID REFLUX   [DISCONTINUED] amoxicillin -clavulanate (AUGMENTIN ) 875-125 MG tablet Take 1 tablet by mouth 2 (two) times daily. (Patient not taking: Reported on 07/11/2023)   [DISCONTINUED] phentermine  (ADIPEX-P ) 37.5 MG tablet Take 1 tablet (37.5 mg total) by mouth daily before breakfast. (Patient not taking: Reported on 07/11/2023)   No facility-administered encounter medications on file as of 07/11/2023.    Past Medical History:  Diagnosis Date   Asthma     Past Surgical History:  Procedure Laterality Date   BACK SURGERY     CHOLECYSTECTOMY     TUBAL LIGATION      Family History  Problem Relation Age of Onset   Thyroid disease Mother    Heart attack Father    Hyperlipidemia Father    Thyroid disease Sister     Social History   Socioeconomic History   Marital status: Married    Spouse name: Not on file   Number of children: Not on file   Years of education: Not on file   Highest education level: Not on file  Occupational History   Not on file  Tobacco Use   Smoking status: Former    Current packs/day: 0.00    Average packs/day: 0.5 packs/day for 26.0 years (13.0 ttl pk-yrs)    Types: Cigarettes    Start date: 09/20/1995    Quit date: 09/19/2021    Years since quitting: 1.8   Smokeless tobacco: Never  Vaping Use   Vaping status: Some Days  Substance and Sexual Activity   Alcohol use: No   Drug use: No   Sexual activity: Yes    Birth control/protection: Surgical  Other Topics Concern   Not on file  Social History Narrative   Not on file   Social Drivers of Health    Financial Resource Strain: Not on file  Food Insecurity: Not on file  Transportation Needs: Not on file  Physical Activity: Not on file  Stress: Not on file  Social Connections: Not on file  Intimate Partner Violence: Not on file    Review of Systems  Constitutional:  Negative for chills and fever.  HENT:  Negative for congestion and sore throat.   Eyes:  Negative for pain.  Respiratory:  Negative for cough, shortness of breath and wheezing.   Cardiovascular:  Negative for chest pain and leg swelling.  Gastrointestinal:  Negative for abdominal pain, constipation, nausea and vomiting.  Musculoskeletal:  Negative for falls.  Skin:  Negative for itching and rash.  Neurological:  Negative for dizziness and headaches.  Psychiatric/Behavioral:  Positive for depression. The patient is nervous/anxious.    Negative unless indicated in HPI    Objective   BP 120/68   Pulse 78   Temp 97.8 F (36.6 C) (Temporal)   Ht 5\' 3"  (1.6 m)   Wt 273 lb (123.8 kg)   SpO2 96%   BMI 48.36 kg/m   Physical Exam Neurological:     Mental Status: She is oriented to person, place, and time.  Psychiatric:        Attention and Perception: Attention and perception normal.        Mood and Affect: Mood is anxious and depressed.        Speech: Speech normal.        Behavior: Behavior normal. Behavior is cooperative.        Thought Content: Thought content normal. Thought content does not include homicidal or suicidal ideation. Thought content does not include homicidal or suicidal plan.  Cognition and Memory: Cognition and memory normal.        Judgment: Judgment normal.     Last CBC Lab Results  Component Value Date   WBC 7.5 10/06/2021   HGB 12.6 10/06/2021   HCT 38.9 10/06/2021   MCV 82.8 10/06/2021   MCH 26.8 (L) 10/06/2021   RDW 12.9 10/06/2021   PLT 240 10/06/2021   Last metabolic panel Lab Results  Component Value Date   GLUCOSE 97 06/22/2023   NA 141 06/22/2023   K 4.7  06/22/2023   CL 102 06/22/2023   CO2 22 06/22/2023   BUN 13 06/22/2023   CREATININE 0.76 06/22/2023   EGFR 91 06/22/2023   CALCIUM 9.7 06/22/2023   PROT 6.2 10/06/2021   ALBUMIN 3.7 05/09/2018   BILITOT 0.3 10/06/2021   ALKPHOS 83 05/09/2018   AST 17 10/06/2021   ALT 24 10/06/2021   ANIONGAP 9 05/09/2018   Last lipids Lab Results  Component Value Date   CHOL 169 04/30/2020   HDL 70 04/30/2020   LDLCALC 79 04/30/2020   TRIG 113 04/30/2020   CHOLHDL 2.4 04/30/2020    Last thyroid functions Lab Results  Component Value Date   TSH 1.42 05/11/2023        Assessment & Plan:  Encounter for general adult medical examination with abnormal findings -     CBC with Differential/Platelet -     Comprehensive metabolic panel with GFR -     Lipid panel  GAD (generalized anxiety disorder) -     Amb ref to [facility] -     VITAMIN D 25 Hydroxy (Vit-D Deficiency, Fractures)  Essential hypertension -     Comprehensive metabolic panel with GFR  Severe episode of recurrent major depressive disorder, without psychotic features (HCC) -     Amb ref to [facility] -     VITAMIN D 25 Hydroxy (Vit-D Deficiency, Fractures)  Gastroesophageal reflux disease without esophagitis -     CBC with Differential/Platelet  Class 3 severe obesity with body mass index (BMI) of 45.0 to 49.9 in adult, unspecified obesity type, unspecified whether serious comorbidity present -     Lipid panel -     Bayer DCA Hb A1c Waived  Acute cough -     Azithromycin ; Take 2 tablets on day 1, then 1 tablet daily on days 2 through 5  Dispense: 6 tablet; Refill: 0  Other orders -     Brexpiprazole; Take 1 tablet (0.25 mg total) by mouth daily.  Dispense: 30 tablet; Refill: 1   Megan Parker is a 58 year old Caucasian female seen today to establish care, no acute distress   Anxiety and depression: Plan to continue Prozac  40 mg and add Rexulti due to PHQ-9 and GAD-7 being so high.  She  is taking Klonopin  0.5 mg as needed she is aware that she needs to start weaning herself off Klonopin  because provider will not continue prescribing her Klonopin ,  and refer for for counseling  Patient and/or legal guardian verbally consented to [facility] services about presenting concerns and psychiatric consultation as appropriate.  The services will be billed as appropriate for the patient   GERD: Plan to continue pantoprazole  40 mg daily no refill needed  Hypertension: Well-controlled with losartan  100 mg daily, plan to continue current dose no refill needed  Asthma: Well-controlled with Ventolin , no refill needed  Cough: Z-Pak No. 6 dispense  Obesity: Client to work on diet and exercise, TSH and lipid  ordered  Encourage healthy lifestyle choices, including diet (rich in fruits, vegetables, and lean proteins, and low in salt and simple carbohydrates) and exercise (at least 30 minutes of moderate physical activity daily).     The above assessment and management plan was discussed with the patient. The patient verbalized understanding of and has agreed to the management plan. Patient is aware to call the clinic if they develop any new symptoms or if symptoms persist or worsen. Patient is aware when to return to the clinic for a follow-up visit. Patient educated on when it is appropriate to go to the emergency department.  Return in about 6 weeks (around 08/22/2023) for mental health.   Tyne Banta [place] Thompson, DNP [facility] [address] [place], [place] [address] [phone]  Note: This document was prepared by [employer] voice dictation technology and any errors that results from this process are unintentional.

## 2023-07-11 ENCOUNTER — Ambulatory Visit: Payer: Self-pay

## 2023-07-11 ENCOUNTER — Encounter: Payer: Self-pay | Admitting: Nurse Practitioner

## 2023-07-11 ENCOUNTER — Ambulatory Visit (INDEPENDENT_AMBULATORY_CARE_PROVIDER_SITE_OTHER): Payer: Self-pay | Admitting: Nurse Practitioner

## 2023-07-11 VITALS — BP 120/68 | HR 78 | Temp 97.8°F | Ht 63.0 in | Wt 273.0 lb

## 2023-07-11 DIAGNOSIS — F411 Generalized anxiety disorder: Secondary | ICD-10-CM

## 2023-07-11 DIAGNOSIS — Z0001 Encounter for general adult medical examination with abnormal findings: Secondary | ICD-10-CM | POA: Insufficient documentation

## 2023-07-11 DIAGNOSIS — R051 Acute cough: Secondary | ICD-10-CM

## 2023-07-11 DIAGNOSIS — E66813 Obesity, class 3: Secondary | ICD-10-CM

## 2023-07-11 DIAGNOSIS — K219 Gastro-esophageal reflux disease without esophagitis: Secondary | ICD-10-CM | POA: Diagnosis not present

## 2023-07-11 DIAGNOSIS — I10 Essential (primary) hypertension: Secondary | ICD-10-CM

## 2023-07-11 DIAGNOSIS — Z6841 Body Mass Index (BMI) 40.0 and over, adult: Secondary | ICD-10-CM

## 2023-07-11 DIAGNOSIS — F332 Major depressive disorder, recurrent severe without psychotic features: Secondary | ICD-10-CM | POA: Insufficient documentation

## 2023-07-11 LAB — BAYER DCA HB A1C WAIVED: HB A1C (BAYER DCA - WAIVED): 5.4 % (ref 4.8–5.6)

## 2023-07-11 MED ORDER — BREXPIPRAZOLE 0.25 MG PO TABS
0.2500 mg | ORAL_TABLET | Freq: Every day | ORAL | 1 refills | Status: DC
Start: 2023-07-11 — End: 2023-08-30

## 2023-07-11 MED ORDER — AZITHROMYCIN 250 MG PO TABS: ORAL_TABLET | ORAL | 0 refills | Status: AC

## 2023-07-11 NOTE — Telephone Encounter (Signed)
 [place] Thompson, Adell Hones, NP  You2 minutes ago (4:43 PM)   This an historical medications, based on her presentation, I say yes to continue taking it  She is aware to continue the B complex as recommended above.  Regarding the Rexulti - insurance needs a prior authorization, forwarding message to the PA team.

## 2023-07-11 NOTE — Telephone Encounter (Signed)
 Copied from CRM [id number]. Topic: Clinical - Prescription Issue >> Jul 11, 2023  3:18 PM Stanly Early wrote: Reason for CRM: brexpiprazole (REXULTI) 0.25 MG TABS tablet pharmacy said there are some discrepancy between the doctor and insurance. Reason for Disposition . [1] Caller has NON-URGENT medicine question about med that PCP prescribed AND [2] triager unable to answer question  Protocols used: Medication Question Call-A-AH

## 2023-07-11 NOTE — Telephone Encounter (Signed)
 Copied from CRM [id number]. Topic: Clinical - Medication Question >> Jul 11, 2023  3:21 PM Stanly Early wrote: Reason for CRM: b complex vitamins capsule patient is not sure if she needs to take this.  Please advise

## 2023-07-12 ENCOUNTER — Ambulatory Visit: Payer: Self-pay | Admitting: Nurse Practitioner

## 2023-07-12 LAB — COMPREHENSIVE METABOLIC PANEL WITH GFR
ALT: 33 IU/L — ABNORMAL HIGH (ref 0–32)
AST: 23 IU/L (ref 0–40)
Albumin: 4.2 g/dL (ref 3.8–4.9)
Alkaline Phosphatase: 111 IU/L (ref 44–121)
BUN/Creatinine Ratio: 19 (ref 9–23)
BUN: 15 mg/dL (ref 6–24)
Bilirubin Total: 0.3 mg/dL (ref 0.0–1.2)
CO2: 22 mmol/L (ref 20–29)
Calcium: 9.1 mg/dL (ref 8.7–10.2)
Chloride: 103 mmol/L (ref 96–106)
Creatinine, Ser: 0.78 mg/dL (ref 0.57–1.00)
Globulin, Total: 2.1 g/dL (ref 1.5–4.5)
Glucose: 101 mg/dL — ABNORMAL HIGH (ref 70–99)
Potassium: 4.3 mmol/L (ref 3.5–5.2)
Sodium: 141 mmol/L (ref 134–144)
Total Protein: 6.3 g/dL (ref 6.0–8.5)
eGFR: 89 mL/min/{1.73_m2} (ref >59)

## 2023-07-12 LAB — CBC WITH DIFFERENTIAL/PLATELET
Basophils Absolute: 0.1 10*3/uL (ref 0.0–0.2)
Basos: 1 %
EOS (ABSOLUTE): 0.2 10*3/uL (ref 0.0–0.4)
Eos: 2 %
Hematocrit: 37.2 % (ref 34.0–46.6)
Hemoglobin: 12.2 g/dL (ref 11.1–15.9)
Immature Grans (Abs): 0 10*3/uL (ref 0.0–0.1)
Immature Granulocytes: 0 %
Lymphocytes Absolute: 1.6 10*3/uL (ref 0.7–3.1)
Lymphs: 23 %
MCH: 27.4 pg (ref 26.6–33.0)
MCHC: 32.8 g/dL (ref 31.5–35.7)
MCV: 84 fL (ref 79–97)
Monocytes Absolute: 0.5 10*3/uL (ref 0.1–0.9)
Monocytes: 8 %
Neutrophils Absolute: 4.7 10*3/uL (ref 1.4–7.0)
Neutrophils: 66 %
Platelets: 240 10*3/uL (ref 150–450)
RBC: 4.45 x10E6/uL (ref 3.77–5.28)
RDW: 13.3 % (ref 11.7–15.4)
WBC: 7.1 10*3/uL (ref 3.4–10.8)

## 2023-07-12 LAB — LIPID PANEL
Chol/HDL Ratio: 2.4 ratio (ref 0.0–4.4)
Cholesterol, Total: 169 mg/dL (ref 100–199)
HDL: 69 mg/dL (ref >39)
LDL Chol Calc (NIH): 78 mg/dL (ref 0–99)
Triglycerides: 130 mg/dL (ref 0–149)
VLDL Cholesterol Cal: 22 mg/dL (ref 5–40)

## 2023-07-12 LAB — VITAMIN D 25 HYDROXY (VIT D DEFICIENCY, FRACTURES): Vit D, 25-Hydroxy: 14.3 ng/mL — ABNORMAL LOW (ref 30.0–100.0)

## 2023-07-12 MED ORDER — VITAMIN D (ERGOCALCIFEROL) 1.25 MG (50000 UNIT) PO CAPS: 50000.0000 [IU] | ORAL_CAPSULE | ORAL | 0 refills | Status: DC

## 2023-07-12 NOTE — Telephone Encounter (Signed)
 Patient called to discuss her lab results. This RN read results per Villa Greaser, NP:  "Vitamin D is low we will start you on 50,000 mcg of vitamin D weekly  all other labsa are within normal ranges"  Patient states understanding. Pt wants to know if she should buy the vitamin D OTC or have it prescribed so insurance will cover. Pt confirms the below pharmacy.  [facility] [address], [place] - [address]  [address], [place] [address]   Copied from CRM [id number]. Topic: Clinical - Lab/Test Results >> Jul 12, 2023  3:54 PM Elle L wrote: Reason for CRM: The patient had more questions regarding her lab results and the Vitamin D recommendation. Reason for Disposition  Health Information question, no triage required and triager able to answer question  Answer Assessment - Initial Assessment Questions 1. REASON FOR CALL or QUESTION: "What is your reason for calling today?" or "How can I best help you?" or "What question do you have that I can help answer?"     Lab results  Protocols used: Information Only Call - No Triage-A-AH

## 2023-07-17 ENCOUNTER — Other Ambulatory Visit (HOSPITAL_COMMUNITY): Payer: Self-pay

## 2023-07-18 ENCOUNTER — Other Ambulatory Visit (HOSPITAL_COMMUNITY): Payer: Self-pay

## 2023-07-18 ENCOUNTER — Telehealth: Payer: Self-pay | Admitting: Pharmacy Technician

## 2023-07-18 NOTE — Telephone Encounter (Signed)
 Pharmacy Patient Advocate Encounter   Received notification from Pt Calls Messages that prior authorization for Rexulti 0.25mg  tablet is required/requested.   Insurance verification completed.   The patient is insured through [employer] .   Per phone conversation: Medication is covered however, patient needs to call and arrange mail order with her insurance. She can contact [employer] at [phone].

## 2023-07-25 ENCOUNTER — Ambulatory Visit: Payer: Managed Care, Other (non HMO) | Admitting: Family Medicine

## 2023-07-26 ENCOUNTER — Ambulatory Visit: Admitting: Nurse Practitioner

## 2023-08-02 ENCOUNTER — Other Ambulatory Visit: Payer: Self-pay | Admitting: Nurse Practitioner

## 2023-08-02 NOTE — Telephone Encounter (Signed)
 Copied from CRM [id number]. Topic: Clinical - Medication Refill >> Aug 02, 2023  2:08 PM Thliyah D wrote: Medication: diclofenac  (VOLTAREN ) 75 MG EC tablet  Has the patient contacted their pharmacy? No (Agent: If no, request that the patient contact the pharmacy for the refill. If patient does not wish to contact the pharmacy document the reason why and proceed with request.) (Agent: If yes, when and what did the pharmacy advise?)  This is the patient's preferred pharmacy:  [id number] - [place], [place] - [address] [place] [address] Phone: [phone] Fax: [phone]  Is this the correct pharmacy for this prescription? Yes If no, delete pharmacy and type the correct one.   Has the prescription been filled recently? No  Is the patient out of the medication? Yes  Has the patient been seen for an appointment in the last year OR does the patient have an upcoming appointment? Yes  Can we respond through MyChart? Yes  Agent: Please be advised that Rx refills may take up to 3 business days. We ask that you follow-up with your pharmacy. >> Aug 02, 2023  2:10 PM Thliyah D wrote: Patient only has 2 left and takes this everyday says she out of refills

## 2023-08-05 MED ORDER — DICLOFENAC SODIUM 75 MG PO TBEC: 75.0000 mg | DELAYED_RELEASE_TABLET | Freq: Two times a day (BID) | ORAL | 3 refills | Status: DC

## 2023-08-21 ENCOUNTER — Ambulatory Visit: Admitting: Professional Counselor

## 2023-08-21 DIAGNOSIS — F332 Major depressive disorder, recurrent severe without psychotic features: Secondary | ICD-10-CM
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
## 2023-08-22 ENCOUNTER — Ambulatory Visit: Admitting: Nurse Practitioner

## 2023-08-28 NOTE — Patient Instructions (Signed)
 If your symptoms worsen or you have thoughts of suicide/homicide, PLEASE SEEK IMMEDIATE MEDICAL ATTENTION.  You may always call:   National Suicide Hotline: 988 or (913)195-1115 [place] Crisis Line: 343-675-8477 Crisis Recovery in [place]: [phone]     These are available 24 hours a day, 7 days a week.

## 2023-08-29 ENCOUNTER — Telehealth: Payer: Self-pay | Admitting: Professional Counselor

## 2023-08-29 ENCOUNTER — Ambulatory Visit (INDEPENDENT_AMBULATORY_CARE_PROVIDER_SITE_OTHER): Admitting: Nurse Practitioner

## 2023-08-29 ENCOUNTER — Encounter: Payer: Self-pay | Admitting: Nurse Practitioner

## 2023-08-29 ENCOUNTER — Telehealth: Payer: Self-pay

## 2023-08-29 VITALS — BP 110/71 | HR 79 | Temp 97.4°F | Ht 63.0 in | Wt 274.0 lb

## 2023-08-29 DIAGNOSIS — F332 Major depressive disorder, recurrent severe without psychotic features: Secondary | ICD-10-CM | POA: Diagnosis not present

## 2023-08-29 DIAGNOSIS — K219 Gastro-esophageal reflux disease without esophagitis: Secondary | ICD-10-CM

## 2023-08-29 DIAGNOSIS — E559 Vitamin D deficiency, unspecified: Secondary | ICD-10-CM

## 2023-08-29 DIAGNOSIS — I10 Essential (primary) hypertension: Secondary | ICD-10-CM

## 2023-08-29 DIAGNOSIS — F411 Generalized anxiety disorder: Secondary | ICD-10-CM

## 2023-08-29 MED ORDER — VITAMIN D (ERGOCALCIFEROL) 1.25 MG (50000 UNIT) PO CAPS: 50000.0000 [IU] | ORAL_CAPSULE | ORAL | 0 refills | Status: DC

## 2023-08-29 NOTE — Telephone Encounter (Signed)
 Copied from CRM [id number]. Topic: Clinical - Prescription Issue >> Aug 29, 2023  3:27 PM Santiya F wrote: Reason for CRM: Patient is calling in because the pharmacy never received her prescription for Vitamin D , Ergocalciferol , (DRISDOL ) 1.25 MG (50000 UNIT) CAPS capsule [[id number]]. Patient is requesting it be resent.

## 2023-08-29 NOTE — BH Specialist Note (Signed)
 Virtual Behavioral Health Treatment Plan Team Note  MRN: [id number] NAME: Megan Parker  DATE: 08/31/23  Start time: Start Time: 1007 End time: Stop Time: 1013 Total time: Total Time in Minutes (Visit): 6  Total number of Virtual BH Treatment Team Plan encounters: 1/4  Treatment Team Attendees: Dr. Jenniffer and Redell Corn  Attestation signed by Jenniffer Lavetta DELENA, MD at 08/29/2023 10:13 AM   Collaborative Care Psychiatric Consultant Case Review    Assessment/Provisional Diagnosis Megan Parker is a 58 y.o. year old female with history of HTN, GERD. The patient is referred for depression.  PHQ-9 of 20 GAD-7 of 13   # GAD # MDD   Recommendation Referral to psychiatry.  Increase Prozac  to 80 mg.  Continue Rexulti  0.5 mg daily for now until referred to psychiatry.  BH specialist to follow up.     Thank you for your consult. Please contact our collaborative care team for any questions or concerns.    I spent 20 minutes chart reviewing, discussing with Mr. Corn and documenting in the chart.    Diagnoses:    ICD-10-CM   1. Severe episode of recurrent major depressive disorder, without psychotic features (HCC)  F33.2       Goals, Interventions and Follow-up Plan Goals: Increase healthy adjustment to current life circumstances Interventions: Mindfulness or Relaxation Training Behavioral Activation CBT Cognitive Behavioral Therapy Medication Management Recommendations: Increase prozac  to 80 mg and continue rozalte Follow-up Plan: Refer to Psychiatrist for Medication Management  History of the present illness Presenting Problem/Current Symptoms:  Patient is a 58 year old female presenting for a collaborative care assessment. Her chief complaint is persistent emotional and physical exhaustion. She reports feeling "burnt out" due to the demands of her daily life. 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 Additionally, her adult son has recently moved back in due to his own mental health struggles, further increasing her stress and sense of responsibility.   She reports frequent crying spells, emotional lability, ongoing stress, and constant overthinking. She endorses symptoms suggestive of obsessive-compulsive tendencies, such as needing items to be precisely arranged (e.g., all items in the cabinet facing a certain way), excessive worry about germs, and frequent use of bleach for cleaning. She also notes being easily distracted, feeling overwhelmed by others' problems, and not getting adequate sleep.   Psychiatric history includes diagnoses of Major Depressive Disorder, Generalized Anxiety Disorder, and PTSD. She has not had any prior suicide attempts but reports a previous psychiatric hospitalization following what she described as a "breakdown." She acknowledges a significant trauma history but states she has typically managed her symptoms well enough to function at work and raise her children. However, she now feels emotionally depleted and is seeking support.   She is currently prescribed Prozac , which she believes may be helping somewhat but questions whether it is fully effective.   Screenings PHQ-9 Assessments:     08/29/2023    2:32 PM 08/29/2023    2:28 PM 08/21/2023   10:04 AM  Depression screen PHQ 2/9  Decreased Interest 1 1 2   Down, Depressed, Hopeless 0 0 2  PHQ - 2 Score 1 1 4   Altered sleeping 2 0 3  Tired, decreased energy 2 0 3  Change in appetite 3 0 3  Feeling bad or failure about yourself  1 0 2  Trouble concentrating 3 0 3  Moving slowly or fidgety/restless 3 0  2  Suicidal thoughts 0 0 0  PHQ-9 Score 15 1 20   Difficult doing work/chores Somewhat difficult Somewhat difficult Somewhat difficult   GAD-7 Assessments:     08/29/2023    2:33 PM 08/21/2023   10:05 AM 07/11/2023    8:23 AM 06/26/2023   11:45 AM  GAD 7 : Generalized Anxiety Score  Nervous,  Anxious, on Edge 1 2 2 3   Control/stop worrying 1 2 3 3   Worry too much - different things 2 2 3 3   Trouble relaxing 3 3 3 2   Restless 1 2 3  0  Easily annoyed or irritable 1 1 2 2   Afraid - awful might happen 2 1 2 2   Total GAD 7 Score 11 13 18 15   Anxiety Difficulty  Somewhat difficult Somewhat difficult Very difficult    Past Medical History Past Medical History:  Diagnosis Date   Asthma     Vital signs: There were no vitals filed for this visit.  Allergies:  Allergies as of 08/29/2023 - Review Complete 08/29/2023  Allergen Reaction Noted   Chantix  [varenicline ]  06/21/2015   Codeine Itching 12/04/2012   Levaquin  [levofloxacin ] Other (See Comments) 10/28/2014   Wellbutrin  [bupropion ]  06/21/2015    Medication History Current medications:  Outpatient Encounter Medications as of 08/29/2023  Medication Sig   albuterol  (VENTOLIN  HFA) 108 (90 Base) MCG/ACT inhaler Inhale 2 puffs into the lungs every 6 (six) hours as needed for wheezing or shortness of breath.   b complex vitamins capsule Take 1 capsule by mouth daily.   clonazePAM  (KLONOPIN ) 0.5 MG tablet Take 1/2-1 tab po BID prn anxiety   diclofenac  (VOLTAREN ) 75 MG EC tablet Take 1 tablet (75 mg total) by mouth 2 (two) times daily.   FLUoxetine  (PROZAC ) 40 MG capsule TAKE 2 CAPSULES BY MOUTH ONCE DAILY .   losartan  (COZAAR ) 100 MG tablet Take 1 tablet (100 mg total) by mouth daily.   pantoprazole  (PROTONIX ) 40 MG tablet TAKE 1 TABLET BY MOUTH ONCE DAILY AS NEEDED FOR ACID REFLUX   [DISCONTINUED] brexpiprazole  (REXULTI ) 0.25 MG TABS tablet Take 1 tablet (0.25 mg total) by mouth daily. (Patient not taking: Reported on 08/29/2023)   [DISCONTINUED] Vitamin D , Ergocalciferol , (DRISDOL ) 1.25 MG (50000 UNIT) CAPS capsule Take 1 capsule (50,000 Units total) by mouth every 7 (seven) days.   [DISCONTINUED] Vitamin D , Ergocalciferol , (DRISDOL ) 1.25 MG (50000 UNIT) CAPS capsule Take 1 capsule (50,000 Units total) by mouth every 7 (seven)  days.   No facility-administered encounter medications on file as of 08/29/2023.     Scribe for Treatment Team: [name]

## 2023-08-29 NOTE — Progress Notes (Signed)
 Established Patient Office Visit  Subjective  Patient ID: Megan Parker, female    DOB: 1965/04/19  Age: 58 y.o. MRN: [id number]  Chief Complaint  Patient presents with   Medical Management of Chronic Issues    Mental health f/u    HPI Megan Parker is a 58 yrs old female present 08/29/2023 fro 60-months follow up for chronic diseases management.  Depression, Follow-up  She  was last seen for this 3 months ago. Changes made at last visit include Rexultyi 0.25 mg was added. Clinet reports he never got it.  Received notification from Pt Calls Messages that prior authorization for Rexulti  0.25mg  tablet is required/requested. Insurance verification completed.   The patient is insured through [employer] . Per phone conversation: Medication is covered however, patient needs to call and arrange mail order with her insurance. She can contact [employer] at [phone].   She reports excellent compliance with treatment. She is not having side effects.   She reports excellent tolerance of treatment. Current symptoms include: depressed mood, difficulty concentrating, and fatigue She feels she is Improved since last visit.     08/29/2023    2:32 PM 08/29/2023    2:28 PM 08/21/2023   10:04 AM  Depression screen PHQ 2/9  Decreased Interest 1 1 2   Down, Depressed, Hopeless 0 0 2  PHQ - 2 Score 1 1 4   Altered sleeping 2 0 3  Tired, decreased energy 2 0 3  Change in appetite 3 0 3  Feeling bad or failure about yourself  1 0 2  Trouble concentrating 3 0 3  Moving slowly or fidgety/restless 3 0 2  Suicidal thoughts 0 0 0  PHQ-9 Score 15 1 20   Difficult doing work/chores Somewhat difficult Somewhat difficult Somewhat difficult    Anxiety, Follow-up  She was last seen for anxiety 3 months ago. Changes made at last visit include added Rexulti .   She reports excellent compliance with treatment. She reports excellent tolerance of treatment. She is not having side effects.   She feels her  anxiety is mild and Unchanged since last visit.  Symptoms: No chest pain No difficulty concentrating  No dizziness No fatigue  No feelings of losing control No insomnia  No irritable No palpitations  No panic attacks No racing thoughts  No shortness of breath No sweating  No tremors/shakes    GAD-7 Results    08/29/2023    2:33 PM 08/21/2023   10:05 AM 07/11/2023    8:23 AM  GAD-7 Generalized Anxiety Disorder Screening Tool  1. Feeling Nervous, Anxious, or on Edge 1 2 2   2. Not Being Able to Stop or Control Worrying 1 2 3   3. Worrying Too Much About Different Things 2 2 3   4. Trouble Relaxing 3 3 3   5. Being So Restless it's Hard To Sit Still 1 2 3   6. Becoming Easily Annoyed or Irritable 1 1 2   7. Feeling Afraid As If Something Awful Might Happen 2 1 2   Total GAD-7 Score 11 13 18   Difficulty At Work, Home, or Getting  Along With Others?  Somewhat difficult Somewhat difficult    PHQ-9 Scores    08/29/2023    2:32 PM 08/29/2023    2:28 PM 08/21/2023   10:04 AM  PHQ9 SCORE ONLY  PHQ-9 Total Score 15 1 20     GERD, Follow up:  The patient was last seen for GERD 3 months ago. Changes made since that visit include pantoprazole  40 mg  daily.  She reports excellent compliance with treatment. She is not having side effects. .  She IS experiencing heartburn. She is NOT experiencing abdominal bloating, choking on food, deep pressure at base of neck, dysphagia, or early satiety  Hypertension, follow-up  BP Readings from Last 3 Encounters:  08/29/23 110/71  07/11/23 120/68  06/26/23 113/76   Wt Readings from Last 3 Encounters:  08/29/23 274 lb (124.3 kg)  07/11/23 273 lb (123.8 kg)  06/26/23 267 lb (121.1 kg)     She was last seen for hypertension 3 months ago.  BP at that visit was 120/60 at. Management since that visit includes Cozaar  100 mg daily.  She reports excellent compliance with treatment. She is not having side effects.  She is following a Regular diet. She is  not exercising. She does not smoke.  Use of agents associated with hypertension: none.   Outside blood pressures are not being monitored. Symptoms: No chest pain No chest pressure  No palpitations No syncope  No dyspnea No orthopnea  No paroxysmal nocturnal dyspnea No lower extremity edema   Pertinent labs Lab Results  Component Value Date   CHOL 169 07/11/2023   HDL 69 07/11/2023   LDLCALC 78 07/11/2023   TRIG 130 07/11/2023   CHOLHDL 2.4 07/11/2023   Lab Results  Component Value Date   NA 141 07/11/2023   K 4.3 07/11/2023   CREATININE 0.78 07/11/2023   EGFR 89 07/11/2023   GLUCOSE 101 (H) 07/11/2023   TSH 1.42 05/11/2023     The 10-year ASCVD risk score (Arnett DK, et al., 2019) is: 1.7% Asthma: Well-controlled asthma, managed with Symbicort and Ventolin . Report no recent asthma symptoms, with good control over their condition. Medication adherence is consistent, and there are no recent exacerbations or side effects    Patient Active Problem List   Diagnosis Date Noted   Encounter for general adult medical examination with abnormal findings 07/11/2023   Severe episode of recurrent major depressive disorder, without psychotic features (HCC) 07/11/2023   Gastroesophageal reflux disease without esophagitis 01/25/2023   Essential hypertension 12/16/2021   GAD (generalized anxiety disorder) 05/26/2020   Depression with anxiety 09/27/2018   Reactive airway disease 03/20/2017   Insomnia 09/01/2014   Encounter for long-term opiate analgesic use 09/01/2014   Menorrhagia with regular cycle 06/06/2014   Dyshidrotic eczema 06/01/2014   Morbid obesity (HCC) 12/31/2013   Perimenopause 09/30/2013   Asthma, chronic 03/31/2013   Chronic low back pain 12/11/2012   Past Medical History:  Diagnosis Date   Asthma    Past Surgical History:  Procedure Laterality Date   BACK SURGERY     CHOLECYSTECTOMY     TUBAL LIGATION     Social History   Tobacco Use   Smoking status:  Former    Current packs/day: 0.00    Average packs/day: 0.5 packs/day for 26.0 years (13.0 ttl pk-yrs)    Types: Cigarettes    Start date: 09/20/1995    Quit date: 09/19/2021    Years since quitting: 1.9   Smokeless tobacco: Never  Vaping Use   Vaping status: Some Days  Substance Use Topics   Alcohol use: No   Drug use: No   Social History   Socioeconomic History   Marital status: Married    Spouse name: Not on file   Number of children: Not on file   Years of education: Not on file   Highest education level: Not on file  Occupational History   Not on  file  Tobacco Use   Smoking status: Former    Current packs/day: 0.00    Average packs/day: 0.5 packs/day for 26.0 years (13.0 ttl pk-yrs)    Types: Cigarettes    Start date: 09/20/1995    Quit date: 09/19/2021    Years since quitting: 1.9   Smokeless tobacco: Never  Vaping Use   Vaping status: Some Days  Substance and Sexual Activity   Alcohol use: No   Drug use: No   Sexual activity: Yes    Birth control/protection: Surgical  Other Topics Concern   Not on file  Social History Narrative   Not on file   Social Drivers of Health   Financial Resource Strain: Not on file  Food Insecurity: Not on file  Transportation Needs: Not on file  Physical Activity: Not on file  Stress: Not on file  Social Connections: Not on file  Intimate Partner Violence: Not on file   Family Status  Relation Name Status   Mother  Alive   Father  Alive   Sister  (Not Specified)  No partnership data on file   Family History  Problem Relation Age of Onset   Thyroid disease Mother    Heart attack Father    Hyperlipidemia Father    Thyroid disease Sister    Allergies  Allergen Reactions   Chantix  [Varenicline ]     Severe nightmares   Codeine Itching   Levaquin  [Levofloxacin ] Other (See Comments)    Reaction unknown to patient    Wellbutrin  [Bupropion ]     Made her feel sick      Review of Systems  Constitutional:  Negative  for chills and fever.  HENT:  Negative for congestion and sore throat.   Respiratory:  Negative for cough and shortness of breath.   Cardiovascular:  Negative for chest pain and leg swelling.  Gastrointestinal:  Negative for diarrhea, nausea and vomiting.  Skin:  Negative for itching and rash.  Neurological:  Negative for dizziness.  Psychiatric/Behavioral:  Negative for depression. The patient is not nervous/anxious.    Negative unless indicated in HPI   Objective:     BP 110/71   Pulse 79   Temp (!) 97.4 F (36.3 C) (Temporal)   Ht 5' 3 (1.6 m)   Wt 274 lb (124.3 kg)   SpO2 99%   BMI 48.54 kg/m  BP Readings from Last 3 Encounters:  08/29/23 110/71  07/11/23 120/68  06/26/23 113/76   Wt Readings from Last 3 Encounters:  08/29/23 274 lb (124.3 kg)  07/11/23 273 lb (123.8 kg)  06/26/23 267 lb (121.1 kg)      Physical Exam Vitals and nursing note reviewed.  Constitutional:      General: She is not in acute distress. HENT:     Head: Normocephalic and atraumatic.     Nose: Nose normal.  Eyes:     Extraocular Movements: Extraocular movements intact.     Conjunctiva/sclera: Conjunctivae normal.     Pupils: Pupils are equal, round, and reactive to light.  Cardiovascular:     Heart sounds: Normal heart sounds.  Pulmonary:     Effort: Pulmonary effort is normal.     Breath sounds: Normal breath sounds.  Neurological:     Mental Status: She is alert.  Psychiatric:        Attention and Perception: Attention and perception normal.        Mood and Affect: Mood normal.        Speech: Speech normal.  Behavior: Behavior normal. Behavior is cooperative.        Thought Content: Thought content normal. Thought content does not include homicidal or suicidal ideation. Thought content does not include homicidal or suicidal plan.        Cognition and Memory: Cognition and memory normal.        Judgment: Judgment normal.      No results found for any visits on  08/29/23.  Last CBC Lab Results  Component Value Date   WBC 7.1 07/11/2023   HGB 12.2 07/11/2023   HCT 37.2 07/11/2023   MCV 84 07/11/2023   MCH 27.4 07/11/2023   RDW 13.3 07/11/2023   PLT 240 07/11/2023   Last metabolic panel Lab Results  Component Value Date   GLUCOSE 101 (H) 07/11/2023   NA 141 07/11/2023   K 4.3 07/11/2023   CL 103 07/11/2023   CO2 22 07/11/2023   BUN 15 07/11/2023   CREATININE 0.78 07/11/2023   EGFR 89 07/11/2023   CALCIUM 9.1 07/11/2023   PROT 6.3 07/11/2023   ALBUMIN 4.2 07/11/2023   LABGLOB 2.1 07/11/2023   BILITOT 0.3 07/11/2023   ALKPHOS 111 07/11/2023   AST 23 07/11/2023   ALT 33 (H) 07/11/2023   ANIONGAP 9 05/09/2018   Last lipids Lab Results  Component Value Date   CHOL 169 07/11/2023   HDL 69 07/11/2023   LDLCALC 78 07/11/2023   TRIG 130 07/11/2023   CHOLHDL 2.4 07/11/2023   Last hemoglobin A1c Lab Results  Component Value Date   HGBA1C 5.4 07/11/2023   Last thyroid functions Lab Results  Component Value Date   TSH 1.42 05/11/2023        Assessment & Plan:  Essential hypertension  Gastroesophageal reflux disease without esophagitis  GAD (generalized anxiety disorder)  Severe episode of recurrent major depressive disorder, without psychotic features (HCC)  Vitamin D  deficiency -     Vitamin D  (Ergocalciferol ); Take 1 capsule (50,000 Units total) by mouth every 7 (seven) days.  Dispense: 5 capsule; Refill: 0  Hallee is a 58 year old Caucasian female seen today for chronic disease management, no acute distress Vitamin D  deficiency: Continue vitamin D  50,000 unit weekly refill provided Depression: Continue Prozac  40 mg daily no refill needed Anxiety: Continue Rexulti  0.25 mg no refill needed Hypertension: Continue with Cozaar  100 mg daily no refill needed GERD: Continue pantoprazole  40 mg daily no refill needed Encourage healthy lifestyle choices, including diet (rich in fruits, vegetables, and lean proteins, and low  in salt and simple carbohydrates) and exercise (at least 30 minutes of moderate physical activity daily).     The above assessment and management plan was discussed with the patient. The patient verbalized understanding of and has agreed to the management plan. Patient is aware to call the clinic if they develop any new symptoms or if symptoms persist or worsen. Patient is aware when to return to the clinic for a follow-up visit. Patient educated on when it is appropriate to go to the emergency department.  Return in about 3 months (around 11/29/2023), or if symptoms worsen or fail to improve, for Chronic Diseases Management.    Latiqua Daloia [place] Thompson, DNP [facility] [address] [place], [place] [address] [phone]    Note: This document was prepared by [employer] voice dictation technology and any errors that results from this process are unintentional.

## 2023-08-30 ENCOUNTER — Telehealth: Payer: Self-pay | Admitting: *Deleted

## 2023-08-30 MED ORDER — VITAMIN D (ERGOCALCIFEROL) 1.25 MG (50000 UNIT) PO CAPS: 50000.0000 [IU] | ORAL_CAPSULE | ORAL | 0 refills | Status: DC

## 2023-08-30 MED ORDER — BREXPIPRAZOLE 0.25 MG PO TABS: 0.2500 mg | ORAL_TABLET | Freq: Every day | ORAL | 0 refills | Status: DC

## 2023-08-30 NOTE — Telephone Encounter (Signed)
 The original Vit D rx was set to phone in so I fixed it and sent through e-prescribe. Pt aware rx resent.

## 2023-08-30 NOTE — Telephone Encounter (Signed)
 Fax from [employer] benefits for pt Request for Rexulti  to be sent for 3 mos supply for a zero copay to [facility] [id number] [address] [facility] [phone]

## 2023-08-30 NOTE — Addendum Note (Signed)
 Addended by: RANDINE ARNULFO MATSU on: 08/30/2023 08:21 AM   Modules accepted: Orders

## 2023-09-10 ENCOUNTER — Telehealth: Payer: Self-pay | Admitting: Family Medicine

## 2023-09-10 NOTE — Telephone Encounter (Unsigned)
 Copied from CRM [id number]. Topic: General - Call Back - No Documentation >> Sep 10, 2023  2:00 PM Kevelyn M wrote: Reason for CRM: Patient calling back. It may have been a reminder call for her appointment for [facility]. >> Sep 10, 2023  3:26 PM Emylou G wrote: Patient called.. she confirmed appt.. she received 2 calls.Megan Parker

## 2023-09-11 ENCOUNTER — Ambulatory Visit: Admitting: Professional Counselor

## 2023-09-11 DIAGNOSIS — F332 Major depressive disorder, recurrent severe without psychotic features: Secondary | ICD-10-CM | POA: Diagnosis not present

## 2023-09-11 NOTE — BH Specialist Note (Signed)
 [place] Follow-up  MRN: [id number] NAME: CINTHYA BORS Date: 09/11/23  Start time: Start Time: 0330 End time: Stop Time: 0400 Total time: Total Time in Minutes (Visit): 30 Call number: Visit Number: 3- Third Visit  Reason for call today:  The patient is a 58 year old female who presented for a collaborative care follow-up. She reports noticeable improvement in her mood since starting Resulti. Although she continues to experience fatigue, she attributes much of it to her long work shifts rather than depressive symptoms. She has been engaging in mindfulness practices and reading scripture, which she feels have contributed positively to her overall outlook.  Per the recommendation of the consulting psychiatrist, the patient is being referred to outpatient psychiatry for a comprehensive evaluation. Additionally, due to multiple ongoing stressors and the chronic nature of her symptoms, a referral to a behavioral health counselor for long-term therapy has been initiated.  In the interim, collaborative care will continue to provide supportive counseling and brief interventions until the patient is fully connected to the recommended services.   PHQ-9 Scores:     09/11/2023    3:20 PM 08/29/2023    2:32 PM 08/29/2023    2:28 PM 08/21/2023   10:04 AM 07/11/2023    8:22 AM  Depression screen PHQ 2/9  Decreased Interest 1 1 1 2 2   Down, Depressed, Hopeless 1 0 0 2 3  PHQ - 2 Score 2 1 1 4 5   Altered sleeping 1 2 0 3 3  Tired, decreased energy 2 2 0 3 3  Change in appetite 2 3 0 3 3  Feeling bad or failure about yourself  1 1 0 2 2  Trouble concentrating 2 3 0 3 3  Moving slowly or fidgety/restless 2 3 0 2 1  Suicidal thoughts 0 0 0 0 0  PHQ-9 Score 12 15 1 20 20   Difficult doing work/chores Somewhat difficult Somewhat difficult Somewhat difficult Somewhat difficult Somewhat difficult   GAD-7 Scores:     09/11/2023    3:20 PM 08/29/2023    2:33 PM 08/21/2023   10:05 AM 07/11/2023    8:23 AM  GAD  7 : Generalized Anxiety Score  Nervous, Anxious, on Edge 1 1 2 2   Control/stop worrying 1 1 2 3   Worry too much - different things 1 2 2 3   Trouble relaxing 1 3 3 3   Restless 1 1 2 3   Easily annoyed or irritable 1 1 1 2   Afraid - awful might happen 1 2 1 2   Total GAD 7 Score 7 11 13 18   Anxiety Difficulty Somewhat difficult  Somewhat difficult Somewhat difficult    Stress Current stressors:  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   b complex vitamins capsule Take 1 capsule by mouth daily.   brexpiprazole  (REXULTI ) 0.25 MG TABS tablet Take 1 tablet (0.25 mg total) by mouth daily.   clonazePAM  (KLONOPIN ) 0.5 MG tablet Take 1/2-1 tab po BID prn anxiety   diclofenac  (VOLTAREN ) 75 MG EC tablet Take 1 tablet (75 mg total) by mouth 2 (two) times daily.   FLUoxetine  (PROZAC ) 40 MG capsule TAKE 2 CAPSULES BY  MOUTH ONCE DAILY .   losartan  (COZAAR ) 100 MG tablet Take 1 tablet (100 mg total) by mouth daily.   pantoprazole  (PROTONIX ) 40 MG tablet TAKE 1 TABLET BY MOUTH ONCE DAILY AS NEEDED FOR ACID REFLUX   Vitamin D , Ergocalciferol , (DRISDOL ) 1.25 MG (50000 UNIT) CAPS capsule Take 1 capsule (50,000 Units total) by mouth every 7 (seven) days.   No facility-administered encounter medications on file as of 09/11/2023.     Self-harm Behaviors Risk Assessment Self-harm risk factors:  Depression, stress Patient endorses recent thoughts of harming self:  Denies   Danger to Others Risk Assessment Danger to others risk factors:  None Patient endorses recent thoughts of harming others:  Denies   Goals, Interventions and Follow-up Plan Goals: Increase  healthy adjustment to current life circumstances Interventions: CBT Cognitive Behavioral Therapy Follow-up Plan: Refer to [facility] Therapy and Refer to Psychiatrist for Medication Management    [name]

## 2023-09-29 ENCOUNTER — Other Ambulatory Visit: Payer: Self-pay | Admitting: Nurse Practitioner

## 2023-09-29 DIAGNOSIS — E559 Vitamin D deficiency, unspecified: Secondary | ICD-10-CM

## 2023-10-09 ENCOUNTER — Ambulatory Visit: Admitting: Professional Counselor

## 2023-10-09 DIAGNOSIS — F332 Major depressive disorder, recurrent severe without psychotic features: Secondary | ICD-10-CM | POA: Diagnosis not present

## 2023-10-09 NOTE — BH Specialist Note (Unsigned)
 [place] Virtual BH Telephone Follow-up  MRN: [id number] NAME: Megan Parker Date: 10/09/23  Start time: Start Time: 0330 End time: Stop Time: 0400 Total time: Total Time in Minutes (Visit): 30 Call number: Visit Number: 4- Fourth Visit  Reason for call today:  Patient is a 58 year old female presenting for a collaborative care follow-up. She reports feeling overwhelmed due to multiple stressors, including caring for her husband following a recent hospitalization, providing care for her grandson, and managing a full-time job with 12-hour shifts. She describes difficulty relaxing and sleeping, though she notes that her recent medication adjustment has been helpful in decreasing symptoms of anxiety and depression, which gives her a sense of hope.  The patient acknowledges that much of her stress stems from her current circumstances, which are not easily changeable. She recognizes a tendency to take on more responsibilities than necessary and admits that she often neglects self-care. Interventions discussed focused on identifying boundaries, prioritizing self-care, and reinforcing her existing coping strategies.  She plans to continue with her current medication regimen and is open to considering traditional therapy following her time in collaborative care. A follow-up appointment is scheduled in one month to reassess progress and provide ongoing support.  PHQ-9 Scores:     09/11/2023    3:20 PM 08/29/2023    2:32 PM 08/29/2023    2:28 PM 08/21/2023   10:04 AM 07/11/2023    8:22 AM  Depression screen PHQ 2/9  Decreased Interest 1 1 1 2 2   Down, Depressed, Hopeless 1 0 0 2 3  PHQ - 2 Score 2 1 1 4 5   Altered sleeping 1 2 0 3 3  Tired, decreased energy 2 2 0 3 3  Change in appetite 2 3 0 3 3  Feeling bad or failure about yourself  1 1 0 2 2  Trouble concentrating 2 3 0 3 3  Moving slowly or fidgety/restless 2 3 0 2 1  Suicidal thoughts 0 0 0 0 0  PHQ-9 Score 12 15 1 20 20   Difficult doing  work/chores Somewhat difficult Somewhat difficult Somewhat difficult Somewhat difficult Somewhat difficult   GAD-7 Scores:     09/11/2023    3:20 PM 08/29/2023    2:33 PM 08/21/2023   10:05 AM 07/11/2023    8:23 AM  GAD 7 : Generalized Anxiety Score  Nervous, Anxious, on Edge 1 1 2 2   Control/stop worrying 1 1 2 3   Worry too much - different things 1 2 2 3   Trouble relaxing 1 3 3 3   Restless 1 1 2 3   Easily annoyed or irritable 1 1 1 2   Afraid - awful might happen 1 2 1 2   Total GAD 7 Score 7 11 13 18   Anxiety Difficulty Somewhat difficult  Somewhat difficult Somewhat difficult    Stress Current stressors:  Work, family, sleep Sleep:  Fair Appetite:  Good Coping ability:  Good Patient taking medications as prescribed:  Yes  Current medications:  Outpatient Encounter Medications as of 10/09/2023  Medication Sig   albuterol  (VENTOLIN  HFA) 108 (90 Base) MCG/ACT inhaler Inhale 2 puffs into the lungs every 6 (six) hours as needed for wheezing or shortness of breath.   b complex vitamins capsule Take 1 capsule by mouth daily.   brexpiprazole  (REXULTI ) 0.25 MG TABS tablet Take 1 tablet (0.25 mg total) by mouth daily.   clonazePAM  (KLONOPIN ) 0.5 MG tablet Take 1/2-1 tab po BID prn anxiety   diclofenac  (VOLTAREN ) 75 MG EC tablet Take  1 tablet (75 mg total) by mouth 2 (two) times daily.   FLUoxetine  (PROZAC ) 40 MG capsule TAKE 2 CAPSULES BY MOUTH ONCE DAILY .   losartan  (COZAAR ) 100 MG tablet Take 1 tablet (100 mg total) by mouth daily.   pantoprazole  (PROTONIX ) 40 MG tablet TAKE 1 TABLET BY MOUTH ONCE DAILY AS NEEDED FOR ACID REFLUX   Vitamin D , Ergocalciferol , (DRISDOL ) 1.25 MG (50000 UNIT) CAPS capsule Take 1 capsule by mouth once a week   No facility-administered encounter medications on file as of 10/09/2023.     Self-harm Behaviors Risk Assessment Self-harm risk factors:  Depression, stress Patient endorses recent thoughts of harming self:  Denies    Danger to Others Risk  Assessment Danger to others risk factors:  None Patient endorses recent thoughts of harming others:  Denies  Goals, Interventions and Follow-up Plan Goals: Stress management and improve self care Interventions: Mindfulness or Relaxation Training, Behavioral Activation, and CBT Cognitive Behavioral Therapy Follow-up Plan: 4 week follow up    [name]

## 2023-10-24 ENCOUNTER — Other Ambulatory Visit: Payer: Self-pay | Admitting: Nurse Practitioner

## 2023-10-30 ENCOUNTER — Other Ambulatory Visit: Payer: Self-pay | Admitting: Family Medicine

## 2023-11-13 ENCOUNTER — Ambulatory Visit (INDEPENDENT_AMBULATORY_CARE_PROVIDER_SITE_OTHER): Admitting: Professional Counselor

## 2023-11-13 DIAGNOSIS — F332 Major depressive disorder, recurrent severe without psychotic features: Secondary | ICD-10-CM

## 2023-11-13 NOTE — BH Specialist Note (Signed)
 [place] [facility] Telephone Follow-up  MRN: [id number] NAME: Megan Parker Date: 11/13/23  Start time: Start Time: 0300 End time: Stop Time: 0330 Total time: Total Time in Minutes (Visit): 30 Call number: Visit Number: 5-Fifth Visit  Reason for call today:  Patient is a 58 year old female presenting for collaborative care follow-up. She arrived to session in an exhausted mood, describing feeling worn down and burned out. She continues to work long hours and reports poor sleep. In addition to her work responsibilities, she is also providing care for her husband, her grandchild, and her son. 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 She expressed that she sees little relief in sight given her current demands.  The patient has attempted several behavioral strategies to reduce stress but continues to struggle with fatigue. By the end of the week, she often spends weekends resting at home without much time for leisure or pleasurable activities, before returning to work. She views medication as helpful but not the full solution, noting she does not believe medication alone can address her situation. She remains adherent to her current regimen.  During session, supportive counseling was provided to process stressors and explore coping strategies. Patient primarily used the session to vent and appreciated having a space to talk openly. She is uncertain about what kind of therapy would be most helpful, aside from a less demanding lifestyle, which she feels is not possible at this time.  Plan: Continue supportive counseling within collaborative care. Monitor mood and stress levels. Follow-up scheduled in one month.    PHQ-9 Scores:     11/13/2023    3:30 PM 10/09/2023    3:59 PM 09/11/2023    3:20 PM 08/29/2023    2:32 PM 08/29/2023    2:28 PM  Depression screen PHQ 2/9  Decreased Interest 0 2 1 1 1   Down, Depressed,  Hopeless 0 0 1 0 0  PHQ - 2 Score 0 2 2 1 1   Altered sleeping 1 2 1 2  0  Tired, decreased energy 2 2 2 2  0  Change in appetite 1 2 2 3  0  Feeling bad or failure about yourself  0 0 1 1 0  Trouble concentrating 2 1 2 3  0  Moving slowly or fidgety/restless 0 0 2 3 0  Suicidal thoughts 0 0 0 0 0  PHQ-9 Score 6 9 12 15 1   Difficult doing work/chores Somewhat difficult Somewhat difficult Somewhat difficult Somewhat difficult Somewhat difficult   GAD-7 Scores:     11/13/2023    3:30 PM 10/09/2023    3:59 PM 09/11/2023    3:20 PM 08/29/2023    2:33 PM  GAD 7 : Generalized Anxiety Score  Nervous, Anxious, on Edge 2 2 1 1   Control/stop worrying 1 1 1 1   Worry too much - different things 1 1 1 2   Trouble relaxing 2 2 1 3   Restless 1 2 1 1   Easily annoyed or irritable 2 3 1 1   Afraid - awful might happen 0 0 1 2  Total GAD 7 Score 9 11 7 11   Anxiety Difficulty Somewhat difficult  Somewhat difficult     Stress Current stressors:  Work, grandson Sleep:  Disrupted Appetite:  Good Coping ability:  Fair Patient taking medications as prescribed:    Current medications:  Outpatient Encounter Medications as of 11/13/2023  Medication Sig   albuterol  (VENTOLIN  HFA) 108 (90 Base) MCG/ACT inhaler  Inhale 2 puffs into the lungs every 6 (six) hours as needed for wheezing or shortness of breath.   b complex vitamins capsule Take 1 capsule by mouth daily.   brexpiprazole  (REXULTI ) 0.25 MG TABS tablet Take 1 tablet by mouth once daily   clonazePAM  (KLONOPIN ) 0.5 MG tablet Take 1/2-1 tab po BID prn anxiety   diclofenac  (VOLTAREN ) 75 MG EC tablet Take 1 tablet (75 mg total) by mouth 2 (two) times daily.   FLUoxetine  (PROZAC ) 40 MG capsule TAKE 2 CAPSULES BY MOUTH ONCE DAILY .   losartan  (COZAAR ) 100 MG tablet Take 1 tablet (100 mg total) by mouth daily.   pantoprazole  (PROTONIX ) 40 MG tablet TAKE 1 TABLET BY MOUTH ONCE DAILY AS NEEDED FOR ACID REFLUX   Vitamin D , Ergocalciferol , (DRISDOL ) 1.25 MG (50000  UNIT) CAPS capsule Take 1 capsule by mouth once a week   No facility-administered encounter medications on file as of 11/13/2023.     Self-harm Behaviors Risk Assessment Self-harm risk factors:  Stress and overwhelmeded Patient endorses recent thoughts of harming self:  Denies   Danger to Others Risk Assessment Danger to others risk factors:  None Patient endorses recent thoughts of harming others:  Denies   Substance Use Assessment Patient recently consumed alcohol:  None  Goals, Interventions and Follow-up Plan Goals: Increase healthy adjustment to current life circumstances Interventions: CBT Cognitive Behavioral Therapy Follow-up Plan:   Megan Parker

## 2023-11-20 ENCOUNTER — Other Ambulatory Visit: Payer: Self-pay | Admitting: Family Medicine

## 2023-11-28 NOTE — Progress Notes (Unsigned)
 Subjective:  Patient ID: Megan Parker, female    DOB: 1966/01/09, 59 y.o.   MRN: [id number]  Patient Care Team: Deitra Morton Sebastian Nena, NP as PCP - General (Nurse Practitioner)   Chief Complaint:  No chief complaint on file.   HPI: Megan Parker is a 58 y.o. female presenting on 11/29/2023 for No chief complaint on file.   Discussed the use of AI scribe software for clinical note transcription with the patient, who gave verbal consent to proceed.  History of Present Illness       Relevant past medical, surgical, family, and social history reviewed and updated as indicated.  Allergies and medications reviewed and updated. Data reviewed: Chart in Epic.   Past Medical History:  Diagnosis Date   Asthma     Past Surgical History:  Procedure Laterality Date   BACK SURGERY     CHOLECYSTECTOMY     TUBAL LIGATION      Social History   Socioeconomic History   Marital status: Married    Spouse name: Not on file   Number of children: Not on file   Years of education: Not on file   Highest education level: Not on file  Occupational History   Not on file  Tobacco Use   Smoking status: Former    Current packs/day: 0.00    Average packs/day: 0.5 packs/day for 26.0 years (13.0 ttl pk-yrs)    Types: Cigarettes    Start date: 09/20/1995    Quit date: 09/19/2021    Years since quitting: 2.1   Smokeless tobacco: Never  Vaping Use   Vaping status: Some Days  Substance and Sexual Activity   Alcohol use: No   Drug use: No   Sexual activity: Yes    Birth control/protection: Surgical  Other Topics Concern   Not on file  Social History Narrative   Not on file   Social Drivers of Health   Financial Resource Strain: Medium Risk (11/25/2023)   Overall Financial Resource Strain (CARDIA)    Difficulty of Paying Living Expenses: Somewhat hard  Food Insecurity: No Food Insecurity (11/25/2023)   Hunger Vital Sign    Worried About Running Out of Food in the Last Year: Never  true    Ran Out of Food in the Last Year: Never true  Transportation Needs: No Transportation Needs (11/25/2023)   PRAPARE - Administrator, Civil Service (Medical): No    Lack of Transportation (Non-Medical): No  Physical Activity: Insufficiently Active (11/25/2023)   Exercise Vital Sign    Days of Exercise per Week: 2 days    Minutes of Exercise per Session: 50 min  Stress: Stress Concern Present (11/25/2023)   [employer] of Occupational Health - Occupational Stress Questionnaire    Feeling of Stress: To some extent  Social Connections: Moderately Isolated (11/25/2023)   Social Connection and Isolation Panel    Frequency of Communication with Friends and Family: More than three times a week    Frequency of Social Gatherings with Friends and Family: Three times a week    Attends Religious Services: Never    Active Member of Clubs or Organizations: No    Attends Banker Meetings: Not on file    Marital Status: Married  Intimate Partner Violence: Not on file    Outpatient Encounter Medications as of 11/29/2023  Medication Sig   albuterol  (VENTOLIN  HFA) 108 (90 Base) MCG/ACT inhaler Inhale 2 puffs into the lungs every 6 (six)  hours as needed for wheezing or shortness of breath.   b complex vitamins capsule Take 1 capsule by mouth daily.   brexpiprazole  (REXULTI ) 0.25 MG TABS tablet Take 1 tablet by mouth once daily   clonazePAM  (KLONOPIN ) 0.5 MG tablet Take 1/2-1 tab po BID prn anxiety   diclofenac  (VOLTAREN ) 75 MG EC tablet Take 1 tablet (75 mg total) by mouth 2 (two) times daily.   FLUoxetine  (PROZAC ) 40 MG capsule TAKE 2 CAPSULES BY MOUTH ONCE DAILY .   losartan  (COZAAR ) 100 MG tablet Take 1 tablet (100 mg total) by mouth daily.   pantoprazole  (PROTONIX ) 40 MG tablet TAKE 1 TABLET BY MOUTH ONCE DAILY AS NEEDED FOR ACID REFLUX   Vitamin D , Ergocalciferol , (DRISDOL ) 1.25 MG (50000 UNIT) CAPS capsule Take 1 capsule by mouth once a week   No  facility-administered encounter medications on file as of 11/29/2023.    Allergies  Allergen Reactions   Chantix  [Varenicline ]     Severe nightmares   Codeine Itching   Levaquin  [Levofloxacin ] Other (See Comments)    Reaction unknown to patient    Wellbutrin  [Bupropion ]     Made her feel sick    Pertinent ROS per HPI, otherwise unremarkable      Objective:  There were no vitals taken for this visit.   Wt Readings from Last 3 Encounters:  08/29/23 274 lb (124.3 kg)  07/11/23 273 lb (123.8 kg)  06/26/23 267 lb (121.1 kg)    Physical Exam Physical Exam      Results for orders placed or performed in visit on 07/11/23  Bayer DCA Hb A1c Waived   Collection Time: 07/11/23  8:51 AM  Result Value Ref Range   HB A1C (BAYER DCA - WAIVED) 5.4 4.8 - 5.6 %  CBC with Differential/Platelet   Collection Time: 07/11/23  8:53 AM  Result Value Ref Range   WBC 7.1 3.4 - 10.8 x10E3/uL   RBC 4.45 3.77 - 5.28 x10E6/uL   Hemoglobin 12.2 11.1 - 15.9 g/dL   Hematocrit 62.7 65.9 - 46.6 %   MCV 84 79 - 97 fL   MCH 27.4 26.6 - 33.0 pg   MCHC 32.8 31.5 - 35.7 g/dL   RDW 86.6 88.2 - 84.5 %   Platelets 240 150 - 450 x10E3/uL   Neutrophils 66 Not Estab. %   Lymphs 23 Not Estab. %   Monocytes 8 Not Estab. %   Eos 2 Not Estab. %   Basos 1 Not Estab. %   Neutrophils Absolute 4.7 1.4 - 7.0 x10E3/uL   Lymphocytes Absolute 1.6 0.7 - 3.1 x10E3/uL   Monocytes Absolute 0.5 0.1 - 0.9 x10E3/uL   EOS (ABSOLUTE) 0.2 0.0 - 0.4 x10E3/uL   Basophils Absolute 0.1 0.0 - 0.2 x10E3/uL   Immature Granulocytes 0 Not Estab. %   Immature Grans (Abs) 0.0 0.0 - 0.1 x10E3/uL  Comprehensive metabolic panel with GFR   Collection Time: 07/11/23  8:53 AM  Result Value Ref Range   Glucose 101 (H) 70 - 99 mg/dL   BUN 15 6 - 24 mg/dL   Creatinine, Ser 9.21 0.57 - 1.00 mg/dL   eGFR 89 >40 fO/fpw/8.26   BUN/Creatinine Ratio 19 9 - 23   Sodium 141 134 - 144 mmol/L   Potassium 4.3 3.5 - 5.2 mmol/L   Chloride 103 96 -  106 mmol/L   CO2 22 20 - 29 mmol/L   Calcium 9.1 8.7 - 10.2 mg/dL   Total Protein 6.3 6.0 - 8.5 g/dL  Albumin 4.2 3.8 - 4.9 g/dL   Globulin, Total 2.1 1.5 - 4.5 g/dL   Bilirubin Total 0.3 0.0 - 1.2 mg/dL   Alkaline Phosphatase 111 44 - 121 IU/L   AST 23 0 - 40 IU/L   ALT 33 (H) 0 - 32 IU/L  Lipid panel   Collection Time: 07/11/23  8:53 AM  Result Value Ref Range   Cholesterol, Total 169 100 - 199 mg/dL   Triglycerides 869 0 - 149 mg/dL   HDL 69 >60 mg/dL   VLDL Cholesterol Cal 22 5 - 40 mg/dL   LDL Chol Calc (NIH) 78 0 - 99 mg/dL   Chol/HDL Ratio 2.4 0.0 - 4.4 ratio  VITAMIN D  25 Hydroxy (Vit-D Deficiency, Fractures)   Collection Time: 07/11/23  8:53 AM  Result Value Ref Range   Vit D, 25-Hydroxy 14.3 (L) 30.0 - 100.0 ng/mL       Pertinent labs & imaging results that were available during my care of the patient were reviewed by me and considered in my medical decision making.  Assessment & Plan:  There are no diagnoses linked to this encounter.   Assessment and Plan Assessment & Plan       Continue all other maintenance medications.  Follow up plan: No follow-ups on file.   Continue healthy lifestyle choices, including diet (rich in fruits, vegetables, and lean proteins, and low in salt and simple carbohydrates) and exercise (at least 30 minutes of moderate physical activity daily).  Educational handout given for ***  The above assessment and management plan was discussed with the patient. The patient verbalized understanding of and has agreed to the management plan. Patient is aware to call the clinic if they develop any new symptoms or if symptoms persist or worsen. Patient is aware when to return to the clinic for a follow-up visit. Patient educated on when it is appropriate to go to the emergency department.  @SIGNATURE @

## 2023-11-29 ENCOUNTER — Encounter: Payer: Self-pay | Admitting: Nurse Practitioner

## 2023-11-29 ENCOUNTER — Ambulatory Visit: Admitting: Nurse Practitioner

## 2023-11-29 VITALS — BP 109/69 | HR 74 | Temp 96.7°F | Ht 63.0 in | Wt 266.0 lb

## 2023-11-29 DIAGNOSIS — K219 Gastro-esophageal reflux disease without esophagitis: Secondary | ICD-10-CM | POA: Diagnosis not present

## 2023-11-29 DIAGNOSIS — I10 Essential (primary) hypertension: Secondary | ICD-10-CM

## 2023-11-29 DIAGNOSIS — J4531 Mild persistent asthma with (acute) exacerbation: Secondary | ICD-10-CM

## 2023-11-29 DIAGNOSIS — F418 Other specified anxiety disorders: Secondary | ICD-10-CM | POA: Diagnosis not present

## 2023-11-29 DIAGNOSIS — Z1231 Encounter for screening mammogram for malignant neoplasm of breast: Secondary | ICD-10-CM

## 2023-11-29 DIAGNOSIS — G4700 Insomnia, unspecified: Secondary | ICD-10-CM

## 2023-11-29 DIAGNOSIS — F411 Generalized anxiety disorder: Secondary | ICD-10-CM

## 2023-12-11 ENCOUNTER — Encounter: Payer: Self-pay | Admitting: Nurse Practitioner

## 2023-12-11 ENCOUNTER — Ambulatory Visit (INDEPENDENT_AMBULATORY_CARE_PROVIDER_SITE_OTHER): Admitting: Professional Counselor

## 2023-12-11 DIAGNOSIS — F418 Other specified anxiety disorders: Secondary | ICD-10-CM

## 2023-12-12 ENCOUNTER — Ambulatory Visit
Admission: RE | Admit: 2023-12-12 | Discharge: 2023-12-12 | Disposition: A | Discharge status: Home / Self Care | Source: Ambulatory Visit | Attending: Nurse Practitioner | Admitting: Nurse Practitioner

## 2023-12-17 ENCOUNTER — Ambulatory Visit: Payer: Self-pay | Admitting: Nurse Practitioner

## 2023-12-18 ENCOUNTER — Other Ambulatory Visit: Payer: Self-pay | Admitting: Nurse Practitioner

## 2023-12-18 DIAGNOSIS — E559 Vitamin D deficiency, unspecified: Secondary | ICD-10-CM

## 2023-12-18 NOTE — BH Specialist Note (Signed)
 [place] [facility] Telephone Follow-up  MRN: [id number] NAME: Megan Parker Date: 12/18/23  Start time: Start Time: 0230 End time: Stop Time: 0300 Total time: Total Time in Minutes (Visit): 30 Call number: Visit Number: 6-Sixth Visit  Reason for call today:  The patient is a 58 year old female seen via telehealth for a collaborative care follow-up. She reported ongoing improvement in her mood and overall coping. The primary source of stress continues to be her demanding work schedule and long hours; however, she noted that she is managing it more effectively.  The patient reported improved sleep quality and stated that her medication is working well. She has been focusing on maintaining balance, setting limits, and engaging in self-care to help manage stress. She feels that her mood, coping skills, and overall functioning have improved, which she views as positive progress.  The plan is to continue monthly follow-ups to support ongoing stability and reinforce coping strategies. The next session is scheduled in one month.  PHQ-9 Scores:     11/29/2023    3:32 PM 11/13/2023    3:30 PM 10/09/2023    3:59 PM 09/11/2023    3:20 PM 08/29/2023    2:32 PM  Depression screen PHQ 2/9  Decreased Interest 0 0 2 1 1   Down, Depressed, Hopeless 0 0 0 1 0  PHQ - 2 Score 0 0 2 2 1   Altered sleeping 2 1 2 1 2   Tired, decreased energy 2 2 2 2 2   Change in appetite 0 1 2 2 3   Feeling bad or failure about yourself  0 0 0 1 1  Trouble concentrating 2 2 1 2 3   Moving slowly or fidgety/restless 2 0 0 2 3  Suicidal thoughts 0 0 0 0 0  PHQ-9 Score 8 6 9 12 15   Difficult doing work/chores Somewhat difficult Somewhat difficult Somewhat difficult Somewhat difficult Somewhat difficult   GAD-7 Scores:     11/29/2023    3:32 PM 11/13/2023    3:30 PM 10/09/2023    3:59 PM 09/11/2023    3:20 PM  GAD 7 : Generalized Anxiety Score  Nervous, Anxious, on Edge 2 2 2 1   Control/stop worrying 1 1 1 1   Worry too much -  different things 1 1 1 1   Trouble relaxing 2 2 2 1   Restless 1 1 2 1   Easily annoyed or irritable 2 2 3 1   Afraid - awful might happen 0 0 0 1  Total GAD 7 Score 9 9 11 7   Anxiety Difficulty Somewhat difficult Somewhat difficult  Somewhat difficult    Stress Current stressors:  work Sleep:  good Appetite:  good Coping ability:  good Patient taking medications as prescribed:  Yes  Current medications:  Outpatient Encounter Medications as of 12/11/2023  Medication Sig   albuterol  (VENTOLIN  HFA) 108 (90 Base) MCG/ACT inhaler Inhale 2 puffs into the lungs every 6 (six) hours as needed for wheezing or shortness of breath.   b complex vitamins capsule Take 1 capsule by mouth daily.   brexpiprazole  (REXULTI ) 0.25 MG TABS tablet Take 1 tablet by mouth once daily   diclofenac  (VOLTAREN ) 75 MG EC tablet Take 1 tablet (75 mg total) by mouth 2 (two) times daily.   FLUoxetine  (PROZAC ) 40 MG capsule TAKE 2 CAPSULES BY MOUTH ONCE DAILY .   losartan  (COZAAR ) 100 MG tablet Take 1 tablet (100 mg total) by mouth daily.   pantoprazole  (PROTONIX ) 40 MG tablet TAKE 1 TABLET BY MOUTH ONCE  DAILY AS NEEDED FOR ACID REFLUX   phentermine  (ADIPEX-P ) 37.5 MG tablet Take 37.5 mg by mouth every morning.   [DISCONTINUED] Vitamin D , Ergocalciferol , (DRISDOL ) 1.25 MG (50000 UNIT) CAPS capsule Take 1 capsule by mouth once a week   No facility-administered encounter medications on file as of 12/11/2023.     Self-harm Behaviors Risk Assessment Self-harm risk factors:  None Patient endorses recent thoughts of harming self:  Denies   Danger to Others Risk Assessment Danger to others risk factors:  None Patient endorses recent thoughts of harming others:  Denies   Substance Use Assessment Patient recently consumed alcohol:  Denies  Alcohol Use Disorder Identification Test (AUDIT):     11/25/2023    7:47 AM  Alcohol Use Disorder Test (AUDIT)  1. How often do you have a drink containing alcohol? 0  3. How often  do you have six or more drinks on one occasion? 0     Goals, Interventions and Follow-up Plan Goals: Increase healthy adjustment to current life circumstances Interventions: CBT Cognitive Behavioral Therapy and Medication Monitoring Follow-up Plan:     [name]

## 2024-01-01 ENCOUNTER — Ambulatory Visit (INDEPENDENT_AMBULATORY_CARE_PROVIDER_SITE_OTHER): Admitting: Professional Counselor

## 2024-01-01 DIAGNOSIS — F418 Other specified anxiety disorders: Secondary | ICD-10-CM

## 2024-01-01 NOTE — BH Specialist Note (Unsigned)
 [place] [facility] Telephone Follow-up  MRN: [id number] NAME: Megan Parker Date: 01/01/24  Start time: Start Time: 0330 End time: Stop Time: 0400 Total time: Total Time in Minutes (Visit): 30 Call number: Visit Number: Additional Visit  Reason for call today:  Patient is a 58 year old female who presented for a collaborative care follow-up session. She arrived in a positive and stable mood, though her PHQ-9 and GAD-7 scores increased slightly since her last visit. She attributes this to ongoing, significant stress rather than a decline in overall functioning.  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 She reports persistent difficulty maintaining reliable, high-quality staff, resulting in her needing to cover additional responsibilities regularly. At home, she manages the household, cares for her husband, and provides substantial caregiving for her grandson, describing the role as "raising another kid." This cycle contributes to high stress levels and limited recovery time, as her two days off are spent resting before returning to her demanding work schedule.  Despite these stressors, she reports maintaining adequately and feels she is coping as well as possible given her workload. Her medication regimen remains satisfactory with no concerns or side effects noted. We will continue meeting monthly for supportive counseling to help her manage stress and maintain functioning. Given the nature of her concerns and the ongoing need for structured therapeutic support, we discussed transitioning her to a traditional therapist, as her needs appear more aligned with therapy rather than medication management at this time.   PHQ-9 Scores:     11/29/2023    3:32 PM 11/13/2023    3:30 PM 10/09/2023    3:59 PM 09/11/2023    3:20 PM 08/29/2023    2:32 PM  Depression screen PHQ 2/9  Decreased Interest 0 0 2 1 1   Down, Depressed, Hopeless 0 0 0 1 0  PHQ -  2 Score 0 0 2 2 1   Altered sleeping 2 1 2 1 2   Tired, decreased energy 2 2 2 2 2   Change in appetite 0 1 2 2 3   Feeling bad or failure about yourself  0 0 0 1 1  Trouble concentrating 2 2 1 2 3   Moving slowly or fidgety/restless 2 0 0 2 3  Suicidal thoughts 0 0 0 0 0  PHQ-9 Score 8  6  9  12  15    Difficult doing work/chores Somewhat difficult Somewhat difficult Somewhat difficult Somewhat difficult Somewhat difficult     Data saved with a previous flowsheet row definition   GAD-7 Scores:     11/29/2023    3:32 PM 11/13/2023    3:30 PM 10/09/2023    3:59 PM 09/11/2023    3:20 PM  GAD 7 : Generalized Anxiety Score  Nervous, Anxious, on Edge 2 2 2 1   Control/stop worrying 1 1 1 1   Worry too much - different things 1 1 1 1   Trouble relaxing 2 2 2 1   Restless 1 1 2 1   Easily annoyed or irritable 2 2 3 1   Afraid - awful might happen 0 0 0 1  Total GAD 7 Score 9 9 11 7   Anxiety Difficulty Somewhat difficult Somewhat difficult  Somewhat difficult    Stress Current stressors:  work, kids Sleep:  good Appetite:  fair Coping ability:  Fair Patient taking medications as prescribed:  Yes  Current medications:  Outpatient Encounter Medications as of 01/01/2024  Medication Sig   albuterol  (VENTOLIN  HFA) 108 (90 Base) MCG/ACT  inhaler Inhale 2 puffs into the lungs every 6 (six) hours as needed for wheezing or shortness of breath.   b complex vitamins capsule Take 1 capsule by mouth daily.   brexpiprazole  (REXULTI ) 0.25 MG TABS tablet Take 1 tablet by mouth once daily   diclofenac  (VOLTAREN ) 75 MG EC tablet Take 1 tablet (75 mg total) by mouth 2 (two) times daily.   FLUoxetine  (PROZAC ) 40 MG capsule TAKE 2 CAPSULES BY MOUTH ONCE DAILY .   losartan  (COZAAR ) 100 MG tablet Take 1 tablet (100 mg total) by mouth daily.   pantoprazole  (PROTONIX ) 40 MG tablet TAKE 1 TABLET BY MOUTH ONCE DAILY AS NEEDED FOR ACID REFLUX   phentermine  (ADIPEX-P ) 37.5 MG tablet Take 37.5 mg by mouth every morning.    Vitamin D , Ergocalciferol , (DRISDOL ) 1.25 MG (50000 UNIT) CAPS capsule Take 1 capsule by mouth once a week   No facility-administered encounter medications on file as of 01/01/2024.     Self-harm Behaviors Risk Assessment Self-harm risk factors:  depression Patient endorses recent thoughts of harming self:  Denies   Danger to Others Risk Assessment Danger to others risk factors:  none Patient endorses recent thoughts of harming others:  Denies   Substance Use Assessment Patient recently consumed alcohol:  Denies  Alcohol Use Disorder Identification Test (AUDIT):     11/25/2023    7:47 AM  Alcohol Use Disorder Test (AUDIT)  1. How often do you have a drink containing alcohol? 0  3. How often do you have six or more drinks on one occasion? 0    Goals, Interventions and Follow-up Plan Goals: Increase healthy adjustment to current life circumstances Interventions: Behavioral Activation Follow-up Plan: One month   Megan Parker

## 2024-01-18 ENCOUNTER — Other Ambulatory Visit: Payer: Self-pay | Admitting: Family Medicine

## 2024-01-23 ENCOUNTER — Other Ambulatory Visit: Payer: Self-pay | Admitting: Nurse Practitioner

## 2024-01-25 ENCOUNTER — Other Ambulatory Visit: Payer: Self-pay | Admitting: Family Medicine

## 2024-01-25 ENCOUNTER — Other Ambulatory Visit: Payer: Self-pay | Admitting: Nurse Practitioner

## 2024-01-25 NOTE — Telephone Encounter (Unsigned)
 Copied from CRM [id number]. Topic: Clinical - Medication Refill >> Jan 25, 2024  1:56 PM Lauren C wrote: Medication: diclofenac  (VOLTAREN ) 75 MG EC tablet  Has the patient contacted their pharmacy? No  This is the patient's preferred pharmacy:  [id number] - [place], [place] - [address] [place] [address] Phone: [phone] Fax: [phone]  Is this the correct pharmacy for this prescription? Yes If no, delete pharmacy and type the correct one.   Has the prescription been filled recently? Yes  Is the patient out of the medication? A few left  Has the patient been seen for an appointment in the last year OR does the patient have an upcoming appointment? Yes  Can we respond through MyChart? Yes  Agent: Please be advised that Rx refills may take up to 3 business days. We ask that you follow-up with your pharmacy.

## 2024-01-25 NOTE — Telephone Encounter (Signed)
 Copied from CRM #[id number]. Topic: Clinical - Medication Refill >> Jan 25, 2024  1:53 PM Lauren C wrote: Medication: FLUoxetine  (PROZAC ) 40 MG capsule pantoprazole  (PROTONIX ) 40 MG tablet losartan  (COZAAR ) 100 MG tablet  Has the patient contacted their pharmacy? Yes They sent a refill request for [facility] who is no longer her provider. She switched to [facility].  This is the patient's preferred pharmacy:  [id number] - [place], [place] - [address] [place] [address] Phone: [phone] Fax: [phone]  Is this the correct pharmacy for this prescription? Yes If no, delete pharmacy and type the correct one.   Has the prescription been filled recently? Yes  Is the patient out of the medication? A few left.  Has the patient been seen for an appointment in the last year OR does the patient have an upcoming appointment? Yes  Can we respond through MyChart? Yes  Agent: Please be advised that Rx refills may take up to 3 business days. We ask that you follow-up with your pharmacy.

## 2024-01-28 MED ORDER — DICLOFENAC SODIUM 75 MG PO TBEC: 75.0000 mg | DELAYED_RELEASE_TABLET | Freq: Two times a day (BID) | ORAL | 3 refills | Status: AC

## 2024-01-28 MED ORDER — FLUOXETINE HCL 40 MG PO CAPS: ORAL_CAPSULE | ORAL | 0 refills | Status: AC

## 2024-01-28 MED ORDER — LOSARTAN POTASSIUM 100 MG PO TABS: 100.0000 mg | ORAL_TABLET | Freq: Every day | ORAL | 1 refills | Status: AC

## 2024-01-28 MED ORDER — PANTOPRAZOLE SODIUM 40 MG PO TBEC: DELAYED_RELEASE_TABLET | ORAL | 1 refills | Status: AC

## 2024-02-04 ENCOUNTER — Ambulatory Visit (INDEPENDENT_AMBULATORY_CARE_PROVIDER_SITE_OTHER): Admitting: Professional Counselor

## 2024-02-04 DIAGNOSIS — F418 Other specified anxiety disorders: Secondary | ICD-10-CM

## 2024-02-05 ENCOUNTER — Ambulatory Visit: Admitting: Professional Counselor

## 2024-02-08 NOTE — BH Specialist Note (Unsigned)
 [place] Follow-up  MRN: [id number] NAME: TAMEAH MIHALKO Date: 02/08/2024  Start time: Start Time: 0300 End time: Stop Time: 0330 Total time: Total Time in Minutes (Visit): 30 Call number: Visit Number: Additional Visit  Reason for call today:  The patient is a 58 year old female who presented for a collaborative care follow-up visit reporting a particularly difficult couple of weeks. She described increased stress related to her current living situation, noting that she is residing with her mother and experiencing feelings of guilt, overwhelm, and emotional exhaustion. The patient reported feeling like a burden at times and struggling with a sense of inadequacy, despite actively trying to stabilize her situation. She endorsed heightened anxiety, low mood, and feeling mentally drained, with stressors impacting her sleep and overall emotional regulation.  Supportive counseling and brief behavioral interventions were provided to help the patient process feelings of guilt, normalize her emotional response to ongoing stressors, and reinforce coping strategies previously discussed. The patient remained engaged in treatment and expressed appreciation for having a space to talk through her challenges. No acute safety concerns were identified during the session. The plan was to continue collaborative care follow-up, provide ongoing emotional support and brief interventions, monitor mood and anxiety symptoms, and reassess progress at the next visit.  PHQ-9 Scores:     11/29/2023    3:32 PM 11/13/2023    3:30 PM 10/09/2023    3:59 PM 09/11/2023    3:20 PM 08/29/2023    2:32 PM  Depression screen PHQ 2/9  Decreased Interest 0 0 2 1 1   Down, Depressed, Hopeless 0 0 0 1 0  PHQ - 2 Score 0 0 2 2 1   Altered sleeping 2 1 2 1 2   Tired, decreased energy 2 2 2 2 2   Change in appetite 0 1 2 2 3   Feeling bad or failure about yourself  0 0 0 1 1  Trouble concentrating 2 2 1 2 3   Moving slowly or fidgety/restless 2  0 0 2 3  Suicidal thoughts 0 0 0 0 0  PHQ-9 Score 8  6  9  12  15    Difficult doing work/chores Somewhat difficult Somewhat difficult Somewhat difficult Somewhat difficult Somewhat difficult     Data saved with a previous flowsheet row definition   GAD-7 Scores:     11/29/2023    3:32 PM 11/13/2023    3:30 PM 10/09/2023    3:59 PM 09/11/2023    3:20 PM  GAD 7 : Generalized Anxiety Score  Nervous, Anxious, on Edge 2 2 2 1   Control/stop worrying 1 1 1 1   Worry too much - different things 1 1 1 1   Trouble relaxing 2 2 2 1   Restless 1 1 2 1   Easily annoyed or irritable 2 2 3 1   Afraid - awful might happen 0 0 0 1  Total GAD 7 Score 9 9 11 7   Anxiety Difficulty Somewhat difficult Somewhat difficult  Somewhat difficult    Stress Current stressors:   Sleep:   Appetite:   Coping ability:   Patient taking medications as prescribed:    Current medications:  Outpatient Encounter Medications as of 02/04/2024  Medication Sig   albuterol  (VENTOLIN  HFA) 108 (90 Base) MCG/ACT inhaler Inhale 2 puffs into the lungs every 6 (six) hours as needed for wheezing or shortness of breath.   b complex vitamins capsule Take 1 capsule by mouth daily.   brexpiprazole  (REXULTI ) 0.25 MG TABS tablet Take 1 tablet by mouth once  daily   diclofenac  (VOLTAREN ) 75 MG EC tablet Take 1 tablet (75 mg total) by mouth 2 (two) times daily.   FLUoxetine  (PROZAC ) 40 MG capsule TAKE 2 CAPSULES BY MOUTH ONCE DAILY .   losartan  (COZAAR ) 100 MG tablet Take 1 tablet (100 mg total) by mouth daily.   pantoprazole  (PROTONIX ) 40 MG tablet TAKE 1 TABLET BY MOUTH ONCE DAILY AS NEEDED FOR  ACID  REFLUX   phentermine  (ADIPEX-P ) 37.5 MG tablet Take 37.5 mg by mouth every morning.   Vitamin D , Ergocalciferol , (DRISDOL ) 1.25 MG (50000 UNIT) CAPS capsule Take 1 capsule by mouth once a week   No facility-administered encounter medications on file as of 02/04/2024.     Self-harm Behaviors Risk Assessment Self-harm risk factors:   Stress, depression Patient endorses recent thoughts of harming self:  Denies   Danger to Others Risk Assessment Danger to others risk factors:  None reported  Patient endorses recent thoughts of harming others:    Dynamic Appraisal of Situational Aggression (DASA):      No data to display          Goals, Interventions and Follow-up Plan Goals: Increase healthy adjustment to current life circumstances Interventions: CBT Cognitive Behavioral Therapy and Medication Monitoring Follow-up Plan: Refer to [facility]    [name]

## 2024-03-03 ENCOUNTER — Encounter: Payer: Self-pay | Admitting: *Deleted

## 2024-03-10 ENCOUNTER — Other Ambulatory Visit: Payer: Self-pay | Admitting: Nurse Practitioner

## 2024-03-10 DIAGNOSIS — E559 Vitamin D deficiency, unspecified: Secondary | ICD-10-CM

## 2024-05-29 ENCOUNTER — Encounter: Admitting: Family Medicine

## 2024-05-29 ENCOUNTER — Ambulatory Visit: Payer: Self-pay | Admitting: Nurse Practitioner
